# Patient Record
Sex: Female | Born: 1960 | Race: Black or African American | Hispanic: No | Marital: Married | Smoking: Current some day smoker
Health system: Southern US, Community
[De-identification: ages and names within clinical notes are randomized; demographics above are authoritative.]

## PROBLEM LIST (undated history)

## (undated) DIAGNOSIS — J3089 Other allergic rhinitis: Secondary | ICD-10-CM

## (undated) DIAGNOSIS — J449 Chronic obstructive pulmonary disease, unspecified: Secondary | ICD-10-CM

## (undated) DIAGNOSIS — Z720 Tobacco use: Secondary | ICD-10-CM

## (undated) DIAGNOSIS — Z6839 Body mass index (BMI) 39.0-39.9, adult: Secondary | ICD-10-CM

## (undated) DIAGNOSIS — I10 Essential (primary) hypertension: Secondary | ICD-10-CM

## (undated) HISTORY — DX: Other allergic rhinitis: J30.89

## (undated) HISTORY — PX: ABDOMINAL HYSTERECTOMY: SHX81

## (undated) HISTORY — DX: Essential (primary) hypertension: I10

---

## 2000-11-27 ENCOUNTER — Ambulatory Visit (HOSPITAL_COMMUNITY): Admission: RE | Admit: 2000-11-27 | Discharge: 2000-11-27 | Payer: Self-pay | Admitting: Family Medicine

## 2000-11-27 ENCOUNTER — Encounter: Payer: Self-pay | Admitting: Family Medicine

## 2002-06-10 ENCOUNTER — Ambulatory Visit (HOSPITAL_COMMUNITY): Admission: RE | Admit: 2002-06-10 | Discharge: 2002-06-10 | Payer: Self-pay | Admitting: Family Medicine

## 2002-06-10 ENCOUNTER — Encounter: Payer: Self-pay | Admitting: Family Medicine

## 2005-02-03 ENCOUNTER — Emergency Department (HOSPITAL_COMMUNITY): Admission: EM | Admit: 2005-02-03 | Discharge: 2005-02-03 | Payer: Self-pay | Admitting: Emergency Medicine

## 2008-07-15 ENCOUNTER — Emergency Department (HOSPITAL_COMMUNITY): Admission: EM | Admit: 2008-07-15 | Discharge: 2008-07-15 | Payer: Self-pay | Admitting: Emergency Medicine

## 2008-09-08 ENCOUNTER — Emergency Department (HOSPITAL_COMMUNITY): Admission: EM | Admit: 2008-09-08 | Discharge: 2008-09-08 | Payer: Self-pay | Admitting: Emergency Medicine

## 2010-03-13 ENCOUNTER — Encounter: Payer: Self-pay | Admitting: Family Medicine

## 2010-04-17 ENCOUNTER — Emergency Department (HOSPITAL_COMMUNITY)
Admission: EM | Admit: 2010-04-17 | Discharge: 2010-04-18 | Disposition: A | Discharge status: Home / Self Care | Payer: BC Managed Care – PPO | Attending: Emergency Medicine | Admitting: Emergency Medicine

## 2010-04-17 DIAGNOSIS — J209 Acute bronchitis, unspecified: Secondary | ICD-10-CM | POA: Insufficient documentation

## 2010-04-17 DIAGNOSIS — R059 Cough, unspecified: Secondary | ICD-10-CM | POA: Insufficient documentation

## 2010-04-17 DIAGNOSIS — R05 Cough: Secondary | ICD-10-CM | POA: Insufficient documentation

## 2010-04-18 ENCOUNTER — Emergency Department (HOSPITAL_COMMUNITY): Payer: BC Managed Care – PPO

## 2010-05-29 LAB — URINALYSIS, ROUTINE W REFLEX MICROSCOPIC
Bilirubin Urine: NEGATIVE
Nitrite: NEGATIVE
Specific Gravity, Urine: 1.02 (ref 1.005–1.030)
Urobilinogen, UA: 0.2 mg/dL (ref 0.0–1.0)

## 2010-05-29 LAB — URINE CULTURE

## 2011-03-14 ENCOUNTER — Emergency Department (HOSPITAL_COMMUNITY): Payer: BC Managed Care – PPO

## 2011-03-14 ENCOUNTER — Encounter (HOSPITAL_COMMUNITY): Payer: Self-pay | Admitting: Emergency Medicine

## 2011-03-14 ENCOUNTER — Emergency Department (HOSPITAL_COMMUNITY)
Admission: EM | Admit: 2011-03-14 | Discharge: 2011-03-14 | Disposition: A | Discharge status: Home / Self Care | Payer: BC Managed Care – PPO | Attending: Emergency Medicine | Admitting: Emergency Medicine

## 2011-03-14 ENCOUNTER — Other Ambulatory Visit: Payer: Self-pay

## 2011-03-14 DIAGNOSIS — R05 Cough: Secondary | ICD-10-CM

## 2011-03-14 DIAGNOSIS — J40 Bronchitis, not specified as acute or chronic: Secondary | ICD-10-CM

## 2011-03-14 DIAGNOSIS — R059 Cough, unspecified: Secondary | ICD-10-CM | POA: Insufficient documentation

## 2011-03-14 DIAGNOSIS — R079 Chest pain, unspecified: Secondary | ICD-10-CM | POA: Insufficient documentation

## 2011-03-14 DIAGNOSIS — Z79899 Other long term (current) drug therapy: Secondary | ICD-10-CM | POA: Insufficient documentation

## 2011-03-14 DIAGNOSIS — F17200 Nicotine dependence, unspecified, uncomplicated: Secondary | ICD-10-CM | POA: Insufficient documentation

## 2011-03-14 DIAGNOSIS — R55 Syncope and collapse: Secondary | ICD-10-CM

## 2011-03-14 DIAGNOSIS — J45909 Unspecified asthma, uncomplicated: Secondary | ICD-10-CM | POA: Insufficient documentation

## 2011-03-14 LAB — URINALYSIS, ROUTINE W REFLEX MICROSCOPIC
Leukocytes, UA: NEGATIVE
Nitrite: NEGATIVE
Specific Gravity, Urine: 1.02 (ref 1.005–1.030)
Urobilinogen, UA: 0.2 mg/dL (ref 0.0–1.0)
pH: 6 (ref 5.0–8.0)

## 2011-03-14 LAB — POCT I-STAT TROPONIN I
Troponin i, poc: 0 ng/mL (ref 0.00–0.08)
Troponin i, poc: 0 ng/mL (ref 0.00–0.08)

## 2011-03-14 LAB — POCT I-STAT, CHEM 8
Calcium, Ion: 1.19 mmol/L (ref 1.12–1.32)
HCT: 53 % — ABNORMAL HIGH (ref 36.0–46.0)
Hemoglobin: 18 g/dL — ABNORMAL HIGH (ref 12.0–15.0)
Sodium: 140 mEq/L (ref 135–145)
TCO2: 28 mmol/L (ref 0–100)

## 2011-03-14 LAB — URINE CULTURE: Culture: NO GROWTH

## 2011-03-14 MED ORDER — PREDNISONE 20 MG PO TABS: 20.0000 mg | ORAL_TABLET | Freq: Every day | ORAL | Status: AC

## 2011-03-14 MED ORDER — IOHEXOL 350 MG/ML SOLN
100.0000 mL | Freq: Once | INTRAVENOUS | Status: AC | PRN
  Administered 2011-03-14: 100 mL via INTRAVENOUS

## 2011-03-14 MED ORDER — AZITHROMYCIN 250 MG PO TABS: 250.0000 mg | ORAL_TABLET | Freq: Every day | ORAL | Status: AC

## 2011-03-14 MED ORDER — ALBUTEROL SULFATE HFA 108 (90 BASE) MCG/ACT IN AERS
2.0000 | INHALATION_SPRAY | RESPIRATORY_TRACT | Status: AC
  Administered 2011-03-14: 2 via RESPIRATORY_TRACT
  Filled 2011-03-14: qty 6.7

## 2011-03-14 MED ORDER — SODIUM CHLORIDE 0.9 % IV SOLN
INTRAVENOUS | Status: DC
  Administered 2011-03-14: 14:00:00 via INTRAVENOUS

## 2011-03-14 MED ORDER — POTASSIUM CHLORIDE 20 MEQ/15ML (10%) PO LIQD
40.0000 meq | Freq: Once | ORAL | Status: AC
  Administered 2011-03-14: 40 meq via ORAL
  Filled 2011-03-14: qty 30

## 2011-03-14 MED ORDER — IPRATROPIUM BROMIDE 0.02 % IN SOLN
0.5000 mg | Freq: Once | RESPIRATORY_TRACT | Status: AC
  Administered 2011-03-14: 0.5 mg via RESPIRATORY_TRACT
  Filled 2011-03-14: qty 2.5

## 2011-03-14 MED ORDER — ACETAMINOPHEN 500 MG PO TABS
1000.0000 mg | ORAL_TABLET | Freq: Once | ORAL | Status: AC
Start: 2011-03-14 — End: 2011-03-14
  Administered 2011-03-14: 1000 mg via ORAL
  Filled 2011-03-14: qty 2

## 2011-03-14 MED ORDER — PREDNISONE 20 MG PO TABS
60.0000 mg | ORAL_TABLET | Freq: Once | ORAL | Status: AC
  Administered 2011-03-14: 60 mg via ORAL
  Filled 2011-03-14: qty 3

## 2011-03-14 MED ORDER — ALBUTEROL SULFATE (5 MG/ML) 0.5% IN NEBU
5.0000 mg | INHALATION_SOLUTION | Freq: Once | RESPIRATORY_TRACT | Status: AC
  Administered 2011-03-14: 5 mg via RESPIRATORY_TRACT
  Filled 2011-03-14: qty 1
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
## 2011-03-14 NOTE — ED Notes (Signed)
Pt requesting Tylenol for pain. Will notify MD of pt request.

## 2012-06-12 ENCOUNTER — Ambulatory Visit (INDEPENDENT_AMBULATORY_CARE_PROVIDER_SITE_OTHER): Payer: BC Managed Care – PPO | Admitting: Nurse Practitioner

## 2012-06-12 ENCOUNTER — Encounter: Payer: Self-pay | Admitting: Nurse Practitioner

## 2012-06-12 VITALS — BP 132/106 | Temp 98.7°F | Wt 282.6 lb

## 2012-06-12 DIAGNOSIS — R591 Generalized enlarged lymph nodes: Secondary | ICD-10-CM

## 2012-06-12 DIAGNOSIS — R599 Enlarged lymph nodes, unspecified: Secondary | ICD-10-CM

## 2012-06-12 DIAGNOSIS — K047 Periapical abscess without sinus: Secondary | ICD-10-CM

## 2012-06-12 DIAGNOSIS — K089 Disorder of teeth and supporting structures, unspecified: Secondary | ICD-10-CM | POA: Insufficient documentation

## 2012-06-12 MED ORDER — PENICILLIN V POTASSIUM 500 MG PO TABS: 500.0000 mg | ORAL_TABLET | Freq: Four times a day (QID) | ORAL | Status: AC

## 2012-06-12 MED ORDER — HYDROCODONE-ACETAMINOPHEN 5-325 MG PO TABS: 1.0000 | ORAL_TABLET | ORAL | Status: DC | PRN

## 2012-06-12 NOTE — Assessment & Plan Note (Signed)
Pen VK as directed for abscess. Vicodin as directed for pain. Recommend dental care.

## 2012-06-12 NOTE — Progress Notes (Signed)
Subjective:  Presents for complaints of swelling and pain along the left lower face that began yesterday morning. Patient was eating a breakfast sandwich and drinking some orange juice. Felt some swelling after a bite of her sandwich with a swallow of orange juice. No burning pain or sore throat. No trouble breathing. No fever. No itching hives or rash. No known allergens. No nausea vomiting. Had a slight nosebleed this morning, thinks she swallowed some of the blood. After this began having some mild abdominal pain. No reflux or abdominal pain before this.  Objective:   BP 132/106  Temp(Src) 98.7 F (37.1 C) (Oral)  Wt 282 lb 9.6 oz (128.187 kg)  BMI 44.25 kg/m2 NAD. Alert, oriented. TMs minimal clear effusion, no erythema. Pharynx clear. Moderate swelling on the lower anterior bucca mucosa, nontender very fluctuant with no lesions or air edema noted. Extremely poor dentition. Externally on the lower mid mandible a small firm nodule noted which is slightly tender to palpation. Neck supple with minimal adenopathy on the right, large tender anterior cervical lymph nodes noted on the left. Lungs clear. Heart regular rate rhythm. Nasal mucosa clear. Abdomen soft nondistended with minimal epigastric area tenderness.  Assessment:Tooth abscess  Tender lymph node  Poor dentition   Plan: Pen VK as directed. Vicodin as directed for severe pain. Warm compresses. Reviewed first aid for nose bleeds. Call back if symptoms worsen or persist.

## 2012-06-17 ENCOUNTER — Telehealth: Payer: Self-pay | Admitting: Family Medicine

## 2012-06-17 NOTE — Telephone Encounter (Signed)
Diflucan 150mg  one po called into [facility] [place] [facility] pt notified

## 2012-06-17 NOTE — Telephone Encounter (Signed)
Diflucan 150 mg one po

## 2012-06-17 NOTE — Telephone Encounter (Signed)
Patient requesting med to be called in for yeast infection, seen Lindsay Nelson recently and antibiotics caused yeast infection, please notify when done [facility]

## 2012-06-25 ENCOUNTER — Encounter: Payer: Self-pay | Admitting: *Deleted

## 2012-06-27 ENCOUNTER — Ambulatory Visit: Payer: BC Managed Care – PPO | Admitting: Nurse Practitioner

## 2014-06-27 ENCOUNTER — Encounter (HOSPITAL_COMMUNITY): Payer: Self-pay | Admitting: *Deleted

## 2014-06-27 ENCOUNTER — Emergency Department (HOSPITAL_COMMUNITY): Payer: BLUE CROSS/BLUE SHIELD

## 2014-06-27 ENCOUNTER — Emergency Department (HOSPITAL_COMMUNITY)
Admission: EM | Admit: 2014-06-27 | Discharge: 2014-06-27 | Disposition: A | Discharge status: Home / Self Care | Payer: BLUE CROSS/BLUE SHIELD | Attending: Emergency Medicine | Admitting: Emergency Medicine

## 2014-06-27 DIAGNOSIS — Z720 Tobacco use: Secondary | ICD-10-CM | POA: Insufficient documentation

## 2014-06-27 DIAGNOSIS — R109 Unspecified abdominal pain: Secondary | ICD-10-CM | POA: Diagnosis not present

## 2014-06-27 DIAGNOSIS — M549 Dorsalgia, unspecified: Secondary | ICD-10-CM | POA: Diagnosis present

## 2014-06-27 DIAGNOSIS — Z79899 Other long term (current) drug therapy: Secondary | ICD-10-CM | POA: Insufficient documentation

## 2014-06-27 DIAGNOSIS — M545 Low back pain, unspecified: Secondary | ICD-10-CM

## 2014-06-27 DIAGNOSIS — I10 Essential (primary) hypertension: Secondary | ICD-10-CM | POA: Diagnosis not present

## 2014-06-27 DIAGNOSIS — Z8709 Personal history of other diseases of the respiratory system: Secondary | ICD-10-CM | POA: Insufficient documentation

## 2014-06-27 DIAGNOSIS — J45909 Unspecified asthma, uncomplicated: Secondary | ICD-10-CM | POA: Diagnosis not present

## 2014-06-27 LAB — COMPREHENSIVE METABOLIC PANEL
ALK PHOS: 101 U/L (ref 38–126)
ALT: 10 U/L — AB (ref 14–54)
ANION GAP: 7 (ref 5–15)
AST: 15 U/L (ref 15–41)
Albumin: 3.5 g/dL (ref 3.5–5.0)
BILIRUBIN TOTAL: 0.5 mg/dL (ref 0.3–1.2)
BUN: 19 mg/dL (ref 6–20)
CO2: 25 mmol/L (ref 22–32)
Calcium: 8.8 mg/dL — ABNORMAL LOW (ref 8.9–10.3)
Chloride: 109 mmol/L (ref 101–111)
Creatinine, Ser: 0.85 mg/dL (ref 0.44–1.00)
GFR calc non Af Amer: >60 mL/min (ref >60)
GLUCOSE: 111 mg/dL — AB (ref 70–99)
Potassium: 3.7 mmol/L (ref 3.5–5.1)
Sodium: 141 mmol/L (ref 135–145)
Total Protein: 6.7 g/dL (ref 6.5–8.1)

## 2014-06-27 LAB — CBC WITH DIFFERENTIAL/PLATELET
BASOS ABS: 0 10*3/uL (ref 0.0–0.1)
BASOS PCT: 0 % (ref 0–1)
Eosinophils Absolute: 0 10*3/uL (ref 0.0–0.7)
Eosinophils Relative: 1 % (ref 0–5)
HCT: 47.6 % — ABNORMAL HIGH (ref 36.0–46.0)
Hemoglobin: 15.9 g/dL — ABNORMAL HIGH (ref 12.0–15.0)
LYMPHS PCT: 32 % (ref 12–46)
Lymphs Abs: 2.2 10*3/uL (ref 0.7–4.0)
MCH: 32.3 pg (ref 26.0–34.0)
MCHC: 33.4 g/dL (ref 30.0–36.0)
MCV: 96.6 fL (ref 78.0–100.0)
MONOS PCT: 9 % (ref 3–12)
Monocytes Absolute: 0.6 10*3/uL (ref 0.1–1.0)
NEUTROS ABS: 4.1 10*3/uL (ref 1.7–7.7)
NEUTROS PCT: 58 % (ref 43–77)
Platelets: 176 10*3/uL (ref 150–400)
RBC: 4.93 MIL/uL (ref 3.87–5.11)
RDW: 13.7 % (ref 11.5–15.5)
WBC: 7 10*3/uL (ref 4.0–10.5)

## 2014-06-27 LAB — URINALYSIS, ROUTINE W REFLEX MICROSCOPIC
BILIRUBIN URINE: NEGATIVE
Glucose, UA: NEGATIVE mg/dL
Ketones, ur: NEGATIVE mg/dL
Leukocytes, UA: NEGATIVE
Nitrite: NEGATIVE
PH: 5.5 (ref 5.0–8.0)
Protein, ur: NEGATIVE mg/dL
SPECIFIC GRAVITY, URINE: 1.015 (ref 1.005–1.030)
Urobilinogen, UA: 0.2 mg/dL (ref 0.0–1.0)

## 2014-06-27 LAB — URINE MICROSCOPIC-ADD ON

## 2014-06-27 MED ORDER — HYDROMORPHONE HCL 1 MG/ML IJ SOLN
0.5000 mg | Freq: Once | INTRAMUSCULAR | Status: AC
  Administered 2014-06-27: 0.5 mg via INTRAVENOUS
  Filled 2014-06-27: qty 1

## 2014-06-27 MED ORDER — TRAMADOL HCL 50 MG PO TABS: 50.0000 mg | ORAL_TABLET | Freq: Four times a day (QID) | ORAL | Status: DC | PRN

## 2014-06-27 MED ORDER — IBUPROFEN 800 MG PO TABS: 800.0000 mg | ORAL_TABLET | Freq: Three times a day (TID) | ORAL | Status: DC

## 2014-06-27 NOTE — Discharge Instructions (Signed)
Back Pain, Adult Low back pain is very common. About 1 in 5 people have back pain.The cause of low back pain is rarely dangerous. The pain often gets better over time.About half of people with a sudden onset of back pain feel better in just 2 weeks. About 8 in 10 people feel better by 6 weeks.  CAUSES Some common causes of back pain include:  Strain of the muscles or ligaments supporting the spine.  Wear and tear (degeneration) of the spinal discs.  Arthritis.  Direct injury to the back. DIAGNOSIS Most of the time, the direct cause of low back pain is not known.However, back pain can be treated effectively even when the exact cause of the pain is unknown.Answering your caregiver's questions about your overall health and symptoms is one of the most accurate ways to make sure the cause of your pain is not dangerous. If your caregiver needs more information, he or she may order lab work or imaging tests (X-rays or MRIs).However, even if imaging tests show changes in your back, this usually does not require surgery. HOME CARE INSTRUCTIONS For many people, back pain returns.Since low back pain is rarely dangerous, it is often a condition that people can learn to manageon their own.   Remain active. It is stressful on the back to sit or stand in one place. Do not sit, drive, or stand in one place for more than 30 minutes at a time. Take short walks on level surfaces as soon as pain allows.Try to increase the length of time you walk each day.  Do not stay in bed.Resting more than 1 or 2 days can delay your recovery.  Do not avoid exercise or work.Your body is made to move.It is not dangerous to be active, even though your back may hurt.Your back will likely heal faster if you return to being active before your pain is gone.  Pay attention to your body when you bend and lift. Many people have less discomfortwhen lifting if they bend their knees, keep the load close to their bodies,and  avoid twisting. Often, the most comfortable positions are those that put less stress on your recovering back.  Find a comfortable position to sleep. Use a firm mattress and lie on your side with your knees slightly bent. If you lie on your back, put a pillow under your knees.  Only take over-the-counter or prescription medicines as directed by your caregiver. Over-the-counter medicines to reduce pain and inflammation are often the most helpful.Your caregiver may prescribe muscle relaxant drugs.These medicines help dull your pain so you can more quickly return to your normal activities and healthy exercise.  Put ice on the injured area.  Put ice in a plastic bag.  Place a towel between your skin and the bag.  Leave the ice on for 15-20 minutes, 03-04 times a day for the first 2 to 3 days. After that, ice and heat may be alternated to reduce pain and spasms.  Ask your caregiver about trying back exercises and gentle massage. This may be of some benefit.  Avoid feeling anxious or stressed.Stress increases muscle tension and can worsen back pain.It is important to recognize when you are anxious or stressed and learn ways to manage it.Exercise is a great option. SEEK MEDICAL CARE IF:  You have pain that is not relieved with rest or medicine.  You have pain that does not improve in 1 week.  You have new symptoms.  You are generally not feeling well. SEEK   IMMEDIATE MEDICAL CARE IF:   You have pain that radiates from your back into your legs.  You develop new bowel or bladder control problems.  You have unusual weakness or numbness in your arms or legs.  You develop nausea or vomiting.  You develop abdominal pain.  You feel faint. Document Released: 02/06/2005 Document Revised: 08/08/2011 Document Reviewed: 06/10/2013 Exit[employer] Patient Information 2015 [employer]. This information is not intended to replace advice given to you by your health care provider. Make sure you  discuss any questions you have with your health care provider.  

## 2014-06-27 NOTE — ED Notes (Signed)
Pt c/o left sided flank pain since yesterday morning. Denies n/v or any urinary symptoms. Does c/o diarrhea.

## 2014-06-27 NOTE — ED Provider Notes (Signed)
CSN: [id number]     Arrival date & time 06/27/14  0612 History   First MD Initiated Contact with Patient 06/27/14 [phone]     Chief Complaint  Patient presents with  . Flank Pain     (Consider location/radiation/quality/duration/timing/severity/associated sxs/prior Treatment) HPI Comments: Patient presents to the ER for evaluation of left sided flank and back pain that started yesterday. Patient denies any injury. Pain is constant and severe, but does worsen with bending, twisting of the back. She does not have any associated chest pain or shortness of breath. There has not been any fever. Patient denies urinary symptoms. No nausea, vomiting, diarrhea or constipation. There is no rash associated with the pain.   Patient is a 54 y.o. female presenting with flank pain.  Flank Pain    Past Medical History  Diagnosis Date  . Asthma   . Hypertension   . Perennial allergic rhinitis    Past Surgical History  Procedure Laterality Date  . Abdominal hysterectomy     History reviewed. No pertinent family history. History  Substance Use Topics  . Smoking status: Current Every Day Smoker    Types: Cigarettes  . Smokeless tobacco: Not on file  . Alcohol Use: Yes   OB History    No data available     Review of Systems  Genitourinary: Positive for flank pain.  Musculoskeletal: Positive for back pain.  All other systems reviewed and are negative.     Allergies  Review of patient's allergies indicates no known allergies.  Home Medications   Prior to Admission medications   Medication Sig Start Date End Date Taking? Authorizing Provider  acetaminophen (TYLENOL) 500 MG tablet Take 500 mg by mouth every 6 (six) hours as needed for mild pain.    Yes Historical Provider, MD  cetirizine (ZYRTEC) 10 MG tablet Take 10 mg by mouth at bedtime.    Yes Historical Provider, MD  ibuprofen (ADVIL,MOTRIN) 200 MG tablet Take 200 mg by mouth every 6 (six) hours as needed for moderate pain.   Yes  Historical Provider, MD  loratadine (CLARITIN) 10 MG tablet Take 10 mg by mouth daily.   Yes Historical Provider, MD   BP 104/86 mmHg  Pulse 69  Temp(Src) 98.4 F (36.9 C) (Oral)  Resp 18  SpO2 96% Physical Exam  Constitutional: She is oriented to person, place, and time. She appears well-developed and well-nourished. No distress.  HENT:  Head: Normocephalic and atraumatic.  Right Ear: Hearing normal.  Left Ear: Hearing normal.  Nose: Nose normal.  Mouth/Throat: Oropharynx is clear and moist and mucous membranes are normal.  Eyes: Conjunctivae and EOM are normal. Pupils are equal, round, and reactive to light.  Neck: Normal range of motion. Neck supple.  Cardiovascular: Regular rhythm, S1 normal and S2 normal.  Exam reveals no gallop and no friction rub.   No murmur heard. Pulmonary/Chest: Effort normal and breath sounds normal. No respiratory distress. She exhibits no tenderness.  Abdominal: Soft. Normal appearance and bowel sounds are normal. There is no hepatosplenomegaly. There is no tenderness. There is no rebound, no guarding, no tenderness at McBurney's point and negative Murphy's sign. No hernia.  Musculoskeletal: Normal range of motion.       Thoracic back: She exhibits tenderness.       Back:  Neurological: She is alert and oriented to person, place, and time. She has normal strength. No cranial nerve deficit or sensory deficit. Coordination normal. GCS eye subscore is 4. GCS verbal subscore is 5.  GCS motor subscore is 6.  Skin: Skin is warm, dry and intact. No rash noted. No cyanosis.  Psychiatric: She has a normal mood and affect. Her speech is normal and behavior is normal. Thought content normal.  Nursing note and vitals reviewed.   ED Course  Procedures (including critical care time) Labs Review Labs Reviewed  CBC WITH DIFFERENTIAL/PLATELET - Abnormal; Notable for the following:    Hemoglobin 15.9 (*)    HCT 47.6 (*)    All other components within normal limits    COMPREHENSIVE METABOLIC PANEL - Abnormal; Notable for the following:    Glucose, Bld 111 (*)    Calcium 8.8 (*)    ALT 10 (*)    All other components within normal limits  URINALYSIS, ROUTINE W REFLEX MICROSCOPIC - Abnormal; Notable for the following:    APPearance HAZY (*)    Hgb urine dipstick SMALL (*)    All other components within normal limits  URINE MICROSCOPIC-ADD ON    Imaging Review Ct Renal Stone Study  06/27/2014   CLINICAL DATA:  54 year old female with a history of left flank pain  EXAM: CT ABDOMEN AND PELVIS WITHOUT CONTRAST  TECHNIQUE: Multidetector CT imaging of the abdomen and pelvis was performed following the standard protocol without IV contrast.  COMPARISON:  Plain film 07/15/2008  FINDINGS: Lower chest:  Unremarkable appearance of the superficial soft tissues the chest wall.  Heart size within normal limits.  No pericardial fluid/ thickening.  Small hiatal hernia.  No lower mediastinal adenopathy.  Respiratory motion somewhat limits evaluation of the lungs.  Trace atelectasis at the bases.  No pleural fluid.  Abdomen/pelvis:  Unremarkable appearance of liver and spleen.  Unremarkable appearance of the right adrenal gland. Rounded lesion associated with the left adrenal gland measuring 2 cm and fat Hounsfield units, compatible with adenoma.  No peripancreatic or pericholecystic fluid or inflammatory changes.  No radio-opaque gallstones.  No intrahepatic or extrahepatic biliary ductal dilatation.  No intra-peritoneal free air or significant free-fluid.  No abnormally dilated small bowel or colon. No transition point. No inflammatory changes of the mesenteries.  Normal appendix identified.  No diverticular disease.  Right Kidney/Ureter:  No hydronephrosis. No nephrolithiasis. No perinephric stranding. Unremarkable course of the right ureter.  Left Kidney/Ureter:  No hydronephrosis. No nephrolithiasis. No perinephric stranding.  Unremarkable course of the left ureter.  Unremarkable  appearance of the urinary bladder.  Surgical changes of hysterectomy. Unremarkable appearance of the right adnexa. No left adnexa identified.  No significant vascular calcification. No aneurysm or periaortic fluid identified.  Musculoskeletal:  No displaced fracture identified.  No significant degenerative changes of the spine.  :  IMPRESSION: Study is negative for nephrolithiasis or hydronephrosis. No finding to account for the patient's complaints of flank pain.  Signed,  Dulcy Fanny. Earleen Newport, DO  [facility]  [facility]   Electronically Signed   By: Corrie Mckusick D.O.   On: 06/27/2014 09:12     EKG Interpretation None      MDM   Final diagnoses:  Left flank pain    Patient presents to the ER for evaluation of left flank pain. Patient did have pain in the left side of her back area that was reproducible with movements. There has not been any injury. Abdominal exam was benign, nontender. Lab work was normal. Urinalysis didn't show some red cells in her urine, CT scan was performed to rule out kidney stone. No abnormality was seen. Patient will be discharged,  treated with analgesia and rest. Follow-up with primary doctor.    Orpah Greek, MD 06/27/14 [phone]

## 2014-06-27 NOTE — ED Notes (Signed)
MD at bedside. 

## 2014-06-27 NOTE — ED Provider Notes (Signed)
MSE was initiated and I personally evaluated the patient and placed orders (if any) at  6:48 AM on Jun 27, 2014.  The patient appears stable so that the remainder of the MSE may be completed by another provider.  Left sided back and flank pain without radiation. Vitals normal. Will tx pain and obtain labs and urine at this time. Pt will be seen by the oncoming provider at [facility], MD 06/27/14 [phone]

## 2017-04-16 ENCOUNTER — Other Ambulatory Visit: Payer: Self-pay

## 2017-04-16 ENCOUNTER — Emergency Department (HOSPITAL_COMMUNITY): Payer: BLUE CROSS/BLUE SHIELD

## 2017-04-16 ENCOUNTER — Encounter (HOSPITAL_COMMUNITY): Payer: Self-pay | Admitting: Emergency Medicine

## 2017-04-16 ENCOUNTER — Observation Stay (HOSPITAL_COMMUNITY)
Admission: EM | Admit: 2017-04-16 | Discharge: 2017-04-17 | Disposition: A | Discharge status: Home / Self Care | Payer: BLUE CROSS/BLUE SHIELD | Attending: Family Medicine | Admitting: Family Medicine

## 2017-04-16 DIAGNOSIS — Z720 Tobacco use: Secondary | ICD-10-CM | POA: Diagnosis present

## 2017-04-16 DIAGNOSIS — D751 Secondary polycythemia: Secondary | ICD-10-CM | POA: Diagnosis present

## 2017-04-16 DIAGNOSIS — Z8249 Family history of ischemic heart disease and other diseases of the circulatory system: Secondary | ICD-10-CM | POA: Insufficient documentation

## 2017-04-16 DIAGNOSIS — E876 Hypokalemia: Secondary | ICD-10-CM | POA: Diagnosis present

## 2017-04-16 DIAGNOSIS — R9431 Abnormal electrocardiogram [ECG] [EKG]: Secondary | ICD-10-CM | POA: Diagnosis not present

## 2017-04-16 DIAGNOSIS — R51 Headache: Secondary | ICD-10-CM | POA: Insufficient documentation

## 2017-04-16 DIAGNOSIS — R0902 Hypoxemia: Secondary | ICD-10-CM

## 2017-04-16 DIAGNOSIS — F17210 Nicotine dependence, cigarettes, uncomplicated: Secondary | ICD-10-CM | POA: Diagnosis not present

## 2017-04-16 DIAGNOSIS — J441 Chronic obstructive pulmonary disease with (acute) exacerbation: Secondary | ICD-10-CM | POA: Diagnosis not present

## 2017-04-16 DIAGNOSIS — J4551 Severe persistent asthma with (acute) exacerbation: Secondary | ICD-10-CM

## 2017-04-16 DIAGNOSIS — R519 Headache, unspecified: Secondary | ICD-10-CM | POA: Diagnosis present

## 2017-04-16 DIAGNOSIS — I10 Essential (primary) hypertension: Secondary | ICD-10-CM | POA: Diagnosis not present

## 2017-04-16 DIAGNOSIS — Z79899 Other long term (current) drug therapy: Secondary | ICD-10-CM | POA: Diagnosis not present

## 2017-04-16 HISTORY — DX: Tobacco use: Z72.0

## 2017-04-16 HISTORY — DX: Chronic obstructive pulmonary disease, unspecified: J44.9

## 2017-04-16 HISTORY — DX: Body mass index (BMI) 39.0-39.9, adult: Z68.39

## 2017-04-16 LAB — BASIC METABOLIC PANEL
Anion gap: 11 (ref 5–15)
BUN: 14 mg/dL (ref 6–20)
CALCIUM: 8.5 mg/dL — AB (ref 8.9–10.3)
CO2: 25 mmol/L (ref 22–32)
Chloride: 102 mmol/L (ref 101–111)
Creatinine, Ser: 0.96 mg/dL (ref 0.44–1.00)
Glucose, Bld: 151 mg/dL — ABNORMAL HIGH (ref 65–99)
POTASSIUM: 3.4 mmol/L — AB (ref 3.5–5.1)
Sodium: 138 mmol/L (ref 135–145)

## 2017-04-16 LAB — CBC WITH DIFFERENTIAL/PLATELET
BASOS PCT: 0 %
Basophils Absolute: 0 10*3/uL (ref 0.0–0.1)
EOS PCT: 0 %
Eosinophils Absolute: 0 10*3/uL (ref 0.0–0.7)
HEMATOCRIT: 47.3 % — AB (ref 36.0–46.0)
Hemoglobin: 15.1 g/dL — ABNORMAL HIGH (ref 12.0–15.0)
LYMPHS PCT: 22 %
Lymphs Abs: 0.9 10*3/uL (ref 0.7–4.0)
MCH: 31.2 pg (ref 26.0–34.0)
MCHC: 31.9 g/dL (ref 30.0–36.0)
MCV: 97.7 fL (ref 78.0–100.0)
Monocytes Absolute: 0.2 10*3/uL (ref 0.1–1.0)
Monocytes Relative: 4 %
NEUTROS ABS: 3 10*3/uL (ref 1.7–7.7)
Neutrophils Relative %: 74 %
PLATELETS: 120 10*3/uL — AB (ref 150–400)
RBC: 4.84 MIL/uL (ref 3.87–5.11)
RDW: 13.3 % (ref 11.5–15.5)
WBC: 4.1 10*3/uL (ref 4.0–10.5)

## 2017-04-16 MED ORDER — PREDNISONE 50 MG PO TABS: ORAL_TABLET | ORAL | 0 refills | Status: DC

## 2017-04-16 MED ORDER — DIPHENHYDRAMINE HCL 50 MG/ML IJ SOLN
25.0000 mg | Freq: Once | INTRAMUSCULAR | Status: AC
  Administered 2017-04-16: 25 mg via INTRAMUSCULAR
  Filled 2017-04-16: qty 1

## 2017-04-16 MED ORDER — DEXAMETHASONE SODIUM PHOSPHATE 10 MG/ML IJ SOLN
10.0000 mg | Freq: Once | INTRAMUSCULAR | Status: AC
  Administered 2017-04-16: 10 mg via INTRAMUSCULAR
  Filled 2017-04-16: qty 1

## 2017-04-16 MED ORDER — ALBUTEROL SULFATE HFA 108 (90 BASE) MCG/ACT IN AERS: 1.0000 | INHALATION_SPRAY | Freq: Four times a day (QID) | RESPIRATORY_TRACT | 0 refills | Status: DC | PRN

## 2017-04-16 MED ORDER — BENZONATATE 100 MG PO CAPS
200.0000 mg | ORAL_CAPSULE | Freq: Once | ORAL | Status: AC
  Administered 2017-04-16: 200 mg via ORAL
  Filled 2017-04-16: qty 2

## 2017-04-16 MED ORDER — ALBUTEROL SULFATE (2.5 MG/3ML) 0.083% IN NEBU: 2.5000 mg | INHALATION_SOLUTION | Freq: Four times a day (QID) | RESPIRATORY_TRACT | 12 refills | Status: DC | PRN

## 2017-04-16 MED ORDER — ALBUTEROL (5 MG/ML) CONTINUOUS INHALATION SOLN
10.0000 mg/h | INHALATION_SOLUTION | Freq: Once | RESPIRATORY_TRACT | Status: AC
  Administered 2017-04-16: 10 mg/h via RESPIRATORY_TRACT
  Filled 2017-04-16: qty 20

## 2017-04-16 MED ORDER — ALBUTEROL SULFATE (2.5 MG/3ML) 0.083% IN NEBU
2.5000 mg | INHALATION_SOLUTION | Freq: Once | RESPIRATORY_TRACT | Status: AC
  Administered 2017-04-16: 2.5 mg via RESPIRATORY_TRACT
  Filled 2017-04-16: qty 3

## 2017-04-16 MED ORDER — BENZONATATE 100 MG PO CAPS: 200.0000 mg | ORAL_CAPSULE | Freq: Three times a day (TID) | ORAL | 0 refills | Status: DC | PRN

## 2017-04-16 MED ORDER — IPRATROPIUM-ALBUTEROL 0.5-2.5 (3) MG/3ML IN SOLN
3.0000 mL | Freq: Once | RESPIRATORY_TRACT | Status: AC
  Administered 2017-04-16: 3 mL via RESPIRATORY_TRACT
  Filled 2017-04-16: qty 3

## 2017-04-16 MED ORDER — PROCHLORPERAZINE EDISYLATE 5 MG/ML IJ SOLN
10.0000 mg | Freq: Once | INTRAMUSCULAR | Status: AC
  Administered 2017-04-16: 10 mg via INTRAMUSCULAR
  Filled 2017-04-16: qty 2

## 2017-04-16 MED ORDER — POTASSIUM CHLORIDE CRYS ER 20 MEQ PO TBCR
40.0000 meq | EXTENDED_RELEASE_TABLET | Freq: Once | ORAL | Status: AC
  Administered 2017-04-17: 40 meq via ORAL
  Filled 2017-04-16: qty 2

## 2017-04-16 NOTE — ED Provider Notes (Addendum)
With shortness of breath and mild cough for the past 3 days.  She also complains of headache which is worse with coughing onset 2 days ago.  She has no headache presently.  Patient reports she is been smoking since age 57.  I counseled patient for 5 minutes on smoking cessation Chest x-ray viewed by me   Orlie Dakin, MD 04/16/17 [id number]    Orlie Dakin, MD 04/16/17 2330

## 2017-04-16 NOTE — ED Notes (Signed)
Pt given a meal tray and drink; pt's family member also given a drink and snack; pt sitting on side of bed and encouraged to call for assistance

## 2017-04-16 NOTE — ED Provider Notes (Signed)
[facility] EMERGENCY DEPARTMENT Provider Note   CSN: [id number] Arrival date & time: 04/16/17  1408     History   Chief Complaint Chief Complaint  Patient presents with  . Headache    HPI Lindsay Nelson is a 57 y.o. female with a past medical history of asthma and hypertension presenting with a 3-day history of headache described as constant sharp pain which started at the base of her left posterior scalp but now radiates into the left forehead and behind her left eye and is been associated with light sensitivity.  Symptoms are worsened by coughing, stating she frequently gets a headache with episodes of cough.  She is also been wheezing without shortness of breath for the past several days.  Her cough is been nonproductive.  She denies fevers or chills, nausea, vomiting, chest pain but has been short of breath.  She is a 1 pack a day smoker but has not smoked since her cough started.  She denies dizziness, focal weakness she has taken Excedrin with transient relief of symptoms.  The history is provided by the patient.    Past Medical History:  Diagnosis Date  . Asthma   . Hypertension   . Perennial allergic rhinitis     Patient Active Problem List   Diagnosis Date Noted  . Poor dentition 06/12/2012    Past Surgical History:  Procedure Laterality Date  . ABDOMINAL HYSTERECTOMY      OB History    No data available       Home Medications    Prior to Admission medications   Medication Sig Start Date End Date Taking? Authorizing Provider  acetaminophen (TYLENOL) 500 MG tablet Take 500 mg by mouth every 6 (six) hours as needed for mild pain.     [provider]  albuterol (PROVENTIL HFA;VENTOLIN HFA) 108 (90 Base) MCG/ACT inhaler Inhale 1-2 puffs into the lungs every 6 (six) hours as needed for wheezing or shortness of breath. 04/16/17   Anesia Blackwell, Almyra Free, PA-C  albuterol (PROVENTIL) (2.5 MG/3ML) 0.083% nebulizer solution Take 3 mLs (2.5 mg total) by nebulization  every 6 (six) hours as needed for wheezing or shortness of breath. 04/16/17   Consepcion Utt, Almyra Free, PA-C  benzonatate (TESSALON) 100 MG capsule Take 2 capsules (200 mg total) by mouth 3 (three) times daily as needed. 04/16/17   Evalee Jefferson, PA-C  cetirizine (ZYRTEC) 10 MG tablet Take 10 mg by mouth at bedtime.     [provider]  ibuprofen (ADVIL,MOTRIN) 800 MG tablet Take 1 tablet (800 mg total) by mouth 3 (three) times daily. 06/27/14   Orpah Greek, MD  loratadine (CLARITIN) 10 MG tablet Take 10 mg by mouth daily.    [provider]  predniSONE (DELTASONE) 50 MG tablet Take one tablet daily for 3 additional days 04/18/17   Evalee Jefferson, PA-C  traMADol (ULTRAM) 50 MG tablet Take 1 tablet (50 mg total) by mouth every 6 (six) hours as needed. 06/27/14   Orpah Greek, MD    Family History No family history on file.  Social History Social History   Tobacco Use  . Smoking status: Current Every Day Smoker    Packs/day: 1.00    Types: Cigarettes  . Smokeless tobacco: Current User  Substance Use Topics  . Alcohol use: Yes    Comment: occasionally   . Drug use: Yes    Types: Marijuana    Comment: last use 04/14/2017     Allergies   Patient has no  known allergies.   Review of Systems Review of Systems  Constitutional: Negative for chills and fever.  HENT: Positive for congestion. Negative for rhinorrhea, sinus pressure, sinus pain and sore throat.   Eyes: Positive for photophobia.  Respiratory: Positive for cough and wheezing. Negative for chest tightness and shortness of breath.   Cardiovascular: Negative for chest pain.  Gastrointestinal: Negative for abdominal pain, nausea and vomiting.  Genitourinary: Negative.   Musculoskeletal: Negative for arthralgias, joint swelling and neck pain.  Skin: Negative.  Negative for rash and wound.  Neurological: Positive for headaches. Negative for dizziness, weakness, light-headedness and numbness.    Psychiatric/Behavioral: Negative.      Physical Exam Updated Vital Signs BP 119/75   Pulse 72   Temp 99.4 F (37.4 C) (Oral)   Resp 16   Ht 5\' 11"  (1.803 m)   Wt 127.9 kg (282 lb)   SpO2 94%   BMI 39.33 kg/m   Physical Exam  Constitutional: She is oriented to person, place, and time. She appears well-developed and well-nourished.  Uncomfortable appearing  HENT:  Head: Normocephalic and atraumatic.  Mouth/Throat: Oropharynx is clear and moist.  Eyes: Conjunctivae and EOM are normal. Pupils are equal, round, and reactive to light. Right eye exhibits normal extraocular motion. Left eye exhibits normal extraocular motion.  Neck: Normal range of motion. Neck supple.  Cardiovascular: Normal rate, regular rhythm, normal heart sounds and intact distal pulses.  Pulmonary/Chest: Effort normal. She has wheezes.  Expiratory wheeze throughout all lung fields.  Decreased decreased breath sounds and prolonged expirations.  Abdominal: Soft. Bowel sounds are normal. There is no tenderness.  Musculoskeletal: Normal range of motion.  Lymphadenopathy:    She has no cervical adenopathy.  Neurological: She is alert and oriented to person, place, and time. She has normal strength. No sensory deficit. Coordination and gait normal. GCS eye subscore is 4. GCS verbal subscore is 5. GCS motor subscore is 6.  normal rapid alternating movements. Cranial nerves III-XII intact.  No pronator drift.  Skin: Skin is warm and dry. No rash noted.  Psychiatric: She has a normal mood and affect. Her speech is normal and behavior is normal. Thought content normal. Cognition and memory are normal.  Nursing note and vitals reviewed.    ED Treatments / Results  Labs (all labs ordered are listed, but only abnormal results are displayed) Labs Reviewed - No data to display  EKG  EKG Interpretation None       Radiology Dg Chest 2 View  Result Date: 04/16/2017 CLINICAL DATA:  Shortness of breath, cough EXAM:  CHEST  2 VIEW COMPARISON:  03/14/2011 FINDINGS: Peribronchial thickening and interstitial prominence, likely bronchitic changes. Heart is normal size. No confluent airspace opacities or effusions. No acute bony abnormality. IMPRESSION: Bronchitic changes. Electronically Signed   By: Rolm Baptise M.D.   On: 04/16/2017 19:55   Ct Head Wo Contrast  Result Date: 04/16/2017 CLINICAL DATA:  Headache EXAM: CT HEAD WITHOUT CONTRAST TECHNIQUE: Contiguous axial images were obtained from the base of the skull through the vertex without intravenous contrast. COMPARISON:  03/14/2011 FINDINGS: Brain: No acute territorial infarction, hemorrhage or intracranial mass is visualized. Normal ventricle size. Vascular: No hyperdense vessels.  No unexpected calcification Skull: Normal. Negative for fracture or focal lesion. Sinuses/Orbits: No acute finding. Other: None IMPRESSION: Negative.  No CT evidence for acute intracranial abnormality Electronically Signed   By: Donavan Foil M.D.   On: 04/16/2017 20:12    Procedures Procedures (including critical care time)  Medications Ordered in ED Medications  benzonatate (TESSALON) capsule 200 mg (not administered)  prochlorperazine (COMPAZINE) injection 10 mg (10 mg Intramuscular Given 04/16/17 1908)  diphenhydrAMINE (BENADRYL) injection 25 mg (25 mg Intramuscular Given 04/16/17 1907)  dexamethasone (DECADRON) injection 10 mg (10 mg Intramuscular Given 04/16/17 1907)  albuterol (PROVENTIL) (2.5 MG/3ML) 0.083% nebulizer solution 2.5 mg (2.5 mg Nebulization Given 04/16/17 1922)  ipratropium-albuterol (DUONEB) 0.5-2.5 (3) MG/3ML nebulizer solution 3 mL (3 mLs Nebulization Given 04/16/17 1922)     Initial Impression / Assessment and Plan / ED Course  I have reviewed the triage vital signs and the nursing notes.  Pertinent labs & imaging results that were available during my care of the patient were reviewed by me and considered in my medical decision making (see chart for  details).     Pt with acute bronchitis vs asthma flare, wheezing improved after neb tx but still with decreased breath sounds. Cxr reviewed, no pneumonia, no fevers.  After neb tx, pt ambulated to bathroom, then recheck of vital signs pulse ox 86%.  She was given an hour long neb tx with persistent hypoxia at rest requiring supplemental oxygen. Pt will require admission for further tx. Discussed with Dr Winfred Leeds who saw pt as well.  Of note, headache was also gone after receiving IM migraine combo of decadron, benadryl and compazine.  No neuro deficits on exam.  Call to [facility] for admission.  Final Clinical Impressions(s) / ED Diagnoses   Final diagnoses:  Acute bronchitis, unspecified organism  Acute nonintractable headache, unspecified headache type    ED Discharge Orders        Ordered    predniSONE (DELTASONE) 50 MG tablet     04/16/17 2031    albuterol (PROVENTIL) (2.5 MG/3ML) 0.083% nebulizer solution  Every 6 hours PRN     04/16/17 2031    albuterol (PROVENTIL HFA;VENTOLIN HFA) 108 (90 Base) MCG/ACT inhaler  Every 6 hours PRN     04/16/17 2031    benzonatate (TESSALON) 100 MG capsule  3 times daily PRN     04/16/17 2031       Evalee Jefferson, PA-C 04/16/17 2358    Orlie Dakin, MD 04/17/17 0040

## 2017-04-16 NOTE — ED Triage Notes (Signed)
Pt c/o HA that starts at base of skull and radiates into face & light sensitivity x 3 days. No hx of migraines.

## 2017-04-16 NOTE — H&P (Signed)
History and Physical    Lindsay Nelson QVZ:[id number] DOB: [birth year] DOA: 04/16/2017  PCP: Kathyrn Drown, MD   Patient coming from: Home.  I have personally briefly reviewed patient's old medical records in [place]  Chief Complaint: Headache.  HPI: Lindsay Nelson is a 57 y.o. female with medical history significant of asthma, hypertension, perennial allergic rhinitis who is coming to the emergency department with complaints of headache, which has been progressively worse since Friday.  She states that her pain radiates into to the left side of her face and is associated with photophobia.  She denies migraine aura, nausea, blurred vision or scotoma.  Mentions that she has had headaches in the past, similar to these many years ago.  While in the ER, the patient was found to be dyspneic and wheezing.  Her O2 saturation was in the 80s off oxygen.  She has being a heavy smoker since age 57 and currently smokes 1 pack of cigarettes per day.  Denies fever, rhinorrhea, sore throat, productive cough, hemoptysis, chest pain, palpitations, dizziness, diaphoresis, PND or orthopnea.  She occasionally gets lower extremity edema, particularly when standing during a long period of time.  Denies abdominal pain, emesis, diarrhea, melena or hematochezia.  Denies dysuria, frequency or hematuria.  No polyphagia, polyuria or blurred vision.  Denies heat or cold intolerance.  Denies pruritus or skin rashes.  ED Course: Initial vital signs temperature 99.28F, pulse 77, respirations 16, blood pressure 132/89 mmHg and O2 sat 96% on room air.  Her white count was 4.1, hemoglobin 15.1 g/dL and platelets 120. BMP showed a potassium of 3.4 millimol/L, glucose was 151 and calcium of 8.5 mg/dL.  All other electrolytes and renal function were normal.  Her magnesium was 1.8 and phosphorus 2.0 mg/dL.    Her chest radiograph shows chronic bronchitic changes.  CT scan of the head did not show any acute intracranial  normality.  Please see images and full radiology report for further detail.  Medications in the ED: The patient received supplemental oxygen, several rounds of bronchodilators, a 200 mg Tessalon Perle, Benadryl 25 mg IM, dexamethasone 10 mg IM and Compazine 10 mg IM.  Review of Systems: As per HPI otherwise 10 point review of systems negative.    Past Medical History:  Diagnosis Date  . Asthma   . Hypertension   . Perennial allergic rhinitis     Past Surgical History:  Procedure Laterality Date  . ABDOMINAL HYSTERECTOMY       reports that she has been smoking cigarettes.  She has been smoking about 1.00 pack per day. She uses smokeless tobacco. She reports that she drinks alcohol. She reports that she uses drugs. Drug: Marijuana.  No Known Allergies  Family History  Problem Relation Age of Onset  . Heart disease Mother   . Coronary artery disease Father 45       Three vessel CABG     Prior to Admission medications   Medication Sig Start Date End Date Taking? Authorizing Provider  aspirin-acetaminophen-caffeine (EXCEDRIN EXTRA STRENGTH) [phone] MG tablet Take 1-2 tablets by mouth daily as needed for headache.    Yes [provider]  cetirizine (ZYRTEC) 10 MG tablet Take 10 mg by mouth at bedtime.    Yes [provider]  fexofenadine (ALLEGRA) 180 MG tablet Take 180 mg by mouth every morning.   Yes [provider]  albuterol (PROVENTIL HFA;VENTOLIN HFA) 108 (90 Base) MCG/ACT inhaler Inhale 1-2 puffs into the lungs every  6 (six) hours as needed for wheezing or shortness of breath. 04/16/17   Idol, Almyra Free, PA-C  albuterol (PROVENTIL) (2.5 MG/3ML) 0.083% nebulizer solution Take 3 mLs (2.5 mg total) by nebulization every 6 (six) hours as needed for wheezing or shortness of breath. 04/16/17   Idol, Almyra Free, PA-C  benzonatate (TESSALON) 100 MG capsule Take 2 capsules (200 mg total) by mouth 3 (three) times daily as needed. 04/16/17   Evalee Jefferson, PA-C  predniSONE  (DELTASONE) 50 MG tablet Take one tablet daily for 3 additional days 04/18/17   Evalee Jefferson, PA-C    Physical Exam: Vitals:   04/16/17 2200 04/16/17 2230 04/16/17 2241 04/16/17 2350  BP: (!) 93/56 110/74 115/73 113/64  Pulse: 93 (!) 102 97 99  Resp: 20 (!) 21 19 20   Temp:      TempSrc:      SpO2: 96% 94% 94% 91%  Weight:      Height:        Constitutional: NAD, calm, comfortable Eyes: PERRL, lids and conjunctivae normal ENMT: Mucous membranes are moist. Posterior pharynx clear of any exudate or lesions. Neck: normal, supple, no masses, no thyromegaly Respiratory: Decreased breath sounds with wheezing and mild rhonchi bilaterally, no crackles. Normal respiratory effort. No accessory muscle use.  Cardiovascular: Regular rate and rhythm, no murmurs / rubs / gallops. No extremity edema. 2+ pedal pulses. No carotid bruits.  Abdomen: Obese, soft, no tenderness, no masses palpated. No hepatosplenomegaly. Bowel sounds positive.  Musculoskeletal: Positive clubbing, no cyanosis. Good ROM, no contractures. Normal muscle tone.  Skin: no rashes, lesions, ulcers on limited dermatological exam. Neurologic: CN 2-12 grossly intact. Sensation intact, DTR normal. Strength 5/5 in all 4.  Psychiatric: Normal judgment and insight. Alert and oriented x 4. Normal mood.    Labs on Admission: I have personally reviewed following labs and imaging studies  CBC: Recent Labs  Lab 04/16/17 2200  WBC 4.1  NEUTROABS 3.0  HGB 15.1*  HCT 47.3*  MCV 97.7  PLT 242*   Basic Metabolic Panel: Recent Labs  Lab 04/16/17 2200  NA 138  K 3.4*  CL 102  CO2 25  GLUCOSE 151*  BUN 14  CREATININE 0.96  CALCIUM 8.5*   GFR: Estimated Creatinine Clearance: 93.2 mL/min (by C-G formula based on SCr of 0.96 mg/dL). Liver Function Tests: No results for input(s): AST, ALT, ALKPHOS, BILITOT, PROT, ALBUMIN in the last 168 hours. No results for input(s): LIPASE, AMYLASE in the last 168 hours. No results for input(s):  AMMONIA in the last 168 hours. Coagulation Profile: No results for input(s): INR, PROTIME in the last 168 hours. Cardiac Enzymes: No results for input(s): CKTOTAL, CKMB, CKMBINDEX, TROPONINI in the last 168 hours. BNP (last 3 results) No results for input(s): PROBNP in the last 8760 hours. HbA1C: No results for input(s): HGBA1C in the last 72 hours. CBG: No results for input(s): GLUCAP in the last 168 hours. Lipid Profile: No results for input(s): CHOL, HDL, LDLCALC, TRIG, CHOLHDL, LDLDIRECT in the last 72 hours. Thyroid Function Tests: No results for input(s): TSH, T4TOTAL, FREET4, T3FREE, THYROIDAB in the last 72 hours. Anemia Panel: No results for input(s): VITAMINB12, FOLATE, FERRITIN, TIBC, IRON, RETICCTPCT in the last 72 hours. Urine analysis:    Component Value Date/Time   COLORURINE YELLOW 06/27/2014 0700   APPEARANCEUR HAZY (A) 06/27/2014 0700   LABSPEC 1.015 06/27/2014 0700   PHURINE 5.5 06/27/2014 0700   GLUCOSEU NEGATIVE 06/27/2014 0700   HGBUR SMALL (A) 06/27/2014 0700   BILIRUBINUR NEGATIVE  06/27/2014 0700   KETONESUR NEGATIVE 06/27/2014 0700   PROTEINUR NEGATIVE 06/27/2014 0700   UROBILINOGEN 0.2 06/27/2014 0700   NITRITE NEGATIVE 06/27/2014 0700   LEUKOCYTESUR NEGATIVE 06/27/2014 0700    Radiological Exams on Admission: Dg Chest 2 View  Result Date: 04/16/2017 CLINICAL DATA:  Shortness of breath, cough EXAM: CHEST  2 VIEW COMPARISON:  03/14/2011 FINDINGS: Peribronchial thickening and interstitial prominence, likely bronchitic changes. Heart is normal size. No confluent airspace opacities or effusions. No acute bony abnormality. IMPRESSION: Bronchitic changes. Electronically Signed   By: Rolm Baptise M.D.   On: 04/16/2017 19:55   Ct Head Wo Contrast  Result Date: 04/16/2017 CLINICAL DATA:  Headache EXAM: CT HEAD WITHOUT CONTRAST TECHNIQUE: Contiguous axial images were obtained from the base of the skull through the vertex without intravenous contrast.  COMPARISON:  03/14/2011 FINDINGS: Brain: No acute territorial infarction, hemorrhage or intracranial mass is visualized. Normal ventricle size. Vascular: No hyperdense vessels.  No unexpected calcification Skull: Normal. Negative for fracture or focal lesion. Sinuses/Orbits: No acute finding. Other: None IMPRESSION: Negative.  No CT evidence for acute intracranial abnormality Electronically Signed   By: Donavan Foil M.D.   On: 04/16/2017 20:12    EKG: Independently reviewed. Vent. rate 92 BPM PR interval * ms QRS duration 116 ms QT/QTc 455/563 ms P-R-T axes 90 2 52 Sinus rhythm Short PR interval Incomplete right bundle branch block Inferior infarct, old  Assessment/Plan Principal Problem:   COPD exacerbation (HCC) Observation/telemetry. Continue supplemental oxygen Continue with scheduled and as needed bronchodilators. Solu-Medrol 40 mg IVP every 6 hours. Smoking cessation was advised and actively encouraged. She should see a pulmonologist for PFTs as an outpatient. Encouraged to follow-up with her PCP, which she has not seen in almost 5 years.  Active Problems:   Headache Much better after Benadryl and Compazine. COPD exacerbation improvement may have helped    Hypertension Currently not on medical therapy    Hypokalemia Replaced.    Hypophosphatemia Replacing.    Abnormal EKG Check echocardiogram. Encouraged to cease tobacco use. Start low-dose aspirin. Recommended to follow-up with her PCP for outpatient stress test.   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 Smoking cessation needed.   DVT prophylaxis: Lovenox SQ. Code Status: Full code. Family Communication: Her husband was present in the ED room. Disposition Plan: Observation for COPD exacerbation treatment. Consults called:  Admission status: Observation/telemetry.   Reubin Milan MD [facility] Pager [phone].  If 7PM-7AM, please contact night-coverage [email or web address] Password  [facility]  04/16/2017, 11:57 PM

## 2017-04-17 ENCOUNTER — Encounter (HOSPITAL_COMMUNITY): Payer: Self-pay | Admitting: Internal Medicine

## 2017-04-17 DIAGNOSIS — R9431 Abnormal electrocardiogram [ECG] [EKG]: Secondary | ICD-10-CM | POA: Diagnosis present

## 2017-04-17 DIAGNOSIS — E876 Hypokalemia: Secondary | ICD-10-CM | POA: Diagnosis not present

## 2017-04-17 DIAGNOSIS — R51 Headache: Secondary | ICD-10-CM

## 2017-04-17 DIAGNOSIS — I10 Essential (primary) hypertension: Secondary | ICD-10-CM | POA: Diagnosis not present

## 2017-04-17 DIAGNOSIS — R519 Headache, unspecified: Secondary | ICD-10-CM | POA: Diagnosis present

## 2017-04-17 DIAGNOSIS — J441 Chronic obstructive pulmonary disease with (acute) exacerbation: Secondary | ICD-10-CM | POA: Diagnosis not present

## 2017-04-17 DIAGNOSIS — D751 Secondary polycythemia: Secondary | ICD-10-CM | POA: Diagnosis present

## 2017-04-17 DIAGNOSIS — Z720 Tobacco use: Secondary | ICD-10-CM | POA: Diagnosis not present

## 2017-04-17 LAB — COMPREHENSIVE METABOLIC PANEL
ALT: 14 U/L (ref 14–54)
AST: 28 U/L (ref 15–41)
Albumin: 3.4 g/dL — ABNORMAL LOW (ref 3.5–5.0)
Alkaline Phosphatase: 74 U/L (ref 38–126)
Anion gap: 10 (ref 5–15)
BUN: 14 mg/dL (ref 6–20)
CALCIUM: 8.7 mg/dL — AB (ref 8.9–10.3)
CHLORIDE: 105 mmol/L (ref 101–111)
CO2: 21 mmol/L — AB (ref 22–32)
CREATININE: 0.83 mg/dL (ref 0.44–1.00)
Glucose, Bld: 162 mg/dL — ABNORMAL HIGH (ref 65–99)
Potassium: 4.1 mmol/L (ref 3.5–5.1)
SODIUM: 136 mmol/L (ref 135–145)
Total Bilirubin: 0.2 mg/dL — ABNORMAL LOW (ref 0.3–1.2)
Total Protein: 7.4 g/dL (ref 6.5–8.1)

## 2017-04-17 LAB — CBC
HCT: 47.4 % — ABNORMAL HIGH (ref 36.0–46.0)
Hemoglobin: 15.5 g/dL — ABNORMAL HIGH (ref 12.0–15.0)
MCH: 31.6 pg (ref 26.0–34.0)
MCHC: 32.7 g/dL (ref 30.0–36.0)
MCV: 96.5 fL (ref 78.0–100.0)
PLATELETS: 122 10*3/uL — AB (ref 150–400)
RBC: 4.91 MIL/uL (ref 3.87–5.11)
RDW: 13.3 % (ref 11.5–15.5)
WBC: 2.5 10*3/uL — ABNORMAL LOW (ref 4.0–10.5)

## 2017-04-17 LAB — MAGNESIUM: MAGNESIUM: 1.8 mg/dL (ref 1.7–2.4)

## 2017-04-17 LAB — PHOSPHORUS: Phosphorus: 2 mg/dL — ABNORMAL LOW (ref 2.5–4.6)

## 2017-04-17 MED ORDER — ASPIRIN EC 81 MG PO TBEC: 81.0000 mg | DELAYED_RELEASE_TABLET | Freq: Every day | ORAL | Status: DC

## 2017-04-17 MED ORDER — IPRATROPIUM-ALBUTEROL 0.5-2.5 (3) MG/3ML IN SOLN
3.0000 mL | Freq: Four times a day (QID) | RESPIRATORY_TRACT | Status: DC
  Administered 2017-04-17: 3 mL via RESPIRATORY_TRACT
  Filled 2017-04-17: qty 3

## 2017-04-17 MED ORDER — POTASSIUM CHLORIDE ER 10 MEQ PO TBCR: 10.0000 meq | EXTENDED_RELEASE_TABLET | Freq: Every day | ORAL | 0 refills | Status: DC

## 2017-04-17 MED ORDER — METHYLPREDNISOLONE SODIUM SUCC 40 MG IJ SOLR: 40.0000 mg | Freq: Four times a day (QID) | INTRAMUSCULAR | Status: DC

## 2017-04-17 MED ORDER — MAGNESIUM SULFATE 2 GM/50ML IV SOLN: 2.0000 g | Freq: Once | INTRAVENOUS | Status: DC

## 2017-04-17 MED ORDER — ACETAMINOPHEN 650 MG RE SUPP: 650.0000 mg | Freq: Four times a day (QID) | RECTAL | Status: DC | PRN

## 2017-04-17 MED ORDER — PREDNISONE 20 MG PO TABS: ORAL_TABLET | ORAL | 0 refills | Status: DC

## 2017-04-17 MED ORDER — ALBUTEROL SULFATE (2.5 MG/3ML) 0.083% IN NEBU: 2.5000 mg | INHALATION_SOLUTION | Freq: Four times a day (QID) | RESPIRATORY_TRACT | Status: DC

## 2017-04-17 MED ORDER — ALBUTEROL SULFATE (2.5 MG/3ML) 0.083% IN NEBU: 2.5000 mg | INHALATION_SOLUTION | RESPIRATORY_TRACT | Status: DC | PRN

## 2017-04-17 MED ORDER — POTASSIUM CHLORIDE IN NACL 20-0.45 MEQ/L-% IV SOLN
INTRAVENOUS | Status: DC
  Administered 2017-04-17: 01:00:00 via INTRAVENOUS
  Filled 2017-04-17 (×3): qty 1000

## 2017-04-17 MED ORDER — ALBUTEROL SULFATE HFA 108 (90 BASE) MCG/ACT IN AERS: 1.0000 | INHALATION_SPRAY | Freq: Four times a day (QID) | RESPIRATORY_TRACT | 0 refills | Status: DC | PRN

## 2017-04-17 MED ORDER — K PHOS MONO-SOD PHOS DI & MONO 155-852-130 MG PO TABS
500.0000 mg | ORAL_TABLET | Freq: Three times a day (TID) | ORAL | Status: DC
  Filled 2017-04-17 (×3): qty 2

## 2017-04-17 MED ORDER — LORATADINE 10 MG PO TABS: 10.0000 mg | ORAL_TABLET | Freq: Every day | ORAL | Status: DC

## 2017-04-17 MED ORDER — ACETAMINOPHEN 325 MG PO TABS: 650.0000 mg | ORAL_TABLET | Freq: Four times a day (QID) | ORAL | Status: DC | PRN

## 2017-04-17 MED ORDER — ENOXAPARIN SODIUM 80 MG/0.8ML ~~LOC~~ SOLN
0.5000 mg/kg | SUBCUTANEOUS | Status: DC
  Administered 2017-04-17: 65 mg via SUBCUTANEOUS
  Filled 2017-04-17: qty 0.8

## 2017-04-17 MED ORDER — ONDANSETRON HCL 4 MG PO TABS: 4.0000 mg | ORAL_TABLET | Freq: Four times a day (QID) | ORAL | Status: DC | PRN

## 2017-04-17 MED ORDER — DOXYCYCLINE HYCLATE 100 MG PO CAPS: 100.0000 mg | ORAL_CAPSULE | Freq: Two times a day (BID) | ORAL | 0 refills | Status: AC

## 2017-04-17 MED ORDER — ONDANSETRON HCL 4 MG/2ML IJ SOLN: 4.0000 mg | Freq: Four times a day (QID) | INTRAMUSCULAR | Status: DC | PRN

## 2017-04-17 MED ORDER — NICOTINE 21 MG/24HR TD PT24: 21.0000 mg | MEDICATED_PATCH | Freq: Every day | TRANSDERMAL | 0 refills | Status: DC

## 2017-04-17 MED ORDER — NICOTINE 21 MG/24HR TD PT24: 21.0000 mg | MEDICATED_PATCH | Freq: Every day | TRANSDERMAL | Status: DC | PRN

## 2017-04-17 MED ORDER — ALBUTEROL SULFATE (2.5 MG/3ML) 0.083% IN NEBU: 2.5000 mg | INHALATION_SOLUTION | Freq: Four times a day (QID) | RESPIRATORY_TRACT | 1 refills | Status: DC | PRN

## 2017-04-17 MED ORDER — IPRATROPIUM BROMIDE 0.02 % IN SOLN: 0.5000 mg | Freq: Four times a day (QID) | RESPIRATORY_TRACT | Status: DC

## 2017-04-17 NOTE — ED Notes (Signed)
Juliann Pulse, [facility] notified of need for 1/2 NS with 5mEq K+

## 2017-04-17 NOTE — Discharge Instructions (Signed)
As discussed your chest xray is negative for pneumonia and your Ct scan is negative for serious reasons for your headache.  Take your next dose of the prednisone Wednesday morning as received a long acting steroid shot tonight (which was for your headache but will also help with your wheezing).  Rest. Get rechecked for worsening or persistent symptoms.   Follow with Primary MD  Lindsay Drown, MD  and other consultant's as instructed your Hospitalist MD  Please get a complete blood count and chemistry panel checked by your Primary MD at your next visit, and again as instructed by your Primary MD.  Get Medicines reviewed and adjusted: Please take all your medications with you for your next visit with your Primary MD  Laboratory/radiological data: Please request your Primary MD to go over all hospital tests and procedure/radiological results at the follow up, please ask your Primary MD to get all Hospital records sent to his/her office.  In some cases, they will be blood work, cultures and biopsy results pending at the time of your discharge. Please request that your primary care M.D. follows up on these results.  Also Note the following: If you experience worsening of your admission symptoms, develop shortness of breath, life threatening emergency, suicidal or homicidal thoughts you must seek medical attention immediately by calling 911 or calling your MD immediately  if symptoms less severe.  You must read complete instructions/literature along with all the possible adverse reactions/side effects for all the Medicines you take and that have been prescribed to you. Take any new Medicines after you have completely understood and accpet all the possible adverse reactions/side effects.   Do not drive when taking Pain medications or sleeping medications (Benzodaizepines)  Do not take more than prescribed Pain, Sleep and Anxiety Medications. It is not advisable to combine anxiety,sleep and pain  medications without talking with your primary care practitioner  Special Instructions: If you have smoked or chewed Tobacco  in the last 2 yrs please stop smoking, stop any regular Alcohol  and or any Recreational drug use.  Wear Seat belts while driving.  Please note: You were cared for by a hospitalist during your hospital stay. Once you are discharged, your primary care physician will handle any further medical issues. Please note that NO REFILLS for any discharge medications will be authorized once you are discharged, as it is imperative that you return to your primary care physician (or establish a relationship with a primary care physician if you do not have one) for your post hospital discharge needs so that they can reassess your need for medications and monitor your lab values.

## 2017-04-17 NOTE — Discharge Summary (Signed)
Physician Discharge Summary  [name] RJJ:[id number] DOB: [birth year] DOA: 04/16/2017  PCP: Kathyrn Drown, MD  Admit date: 04/16/2017 Discharge date: 04/17/2017  Admitted From: HOME Disposition: HOME  Recommendations for Outpatient Follow-up:  1. Follow up with PCP in 1 weeks 2. Please obtain BMP/CBC/Phosphorus  in one week 3. Please continue ongoing tobacco cessation counseling 4. Please follow up on the following pending results: final culture data  Discharge Condition: STABLE   CODE STATUS: FULL    Brief Hospitalization Summary: Please see all hospital notes, images, labs for full details of the hospitalization. HPI: Lindsay Nelson is a 57 y.o. female with medical history significant of asthma, hypertension, perennial allergic rhinitis who is coming to the emergency department with complaints of headache, which has been progressively worse since Friday.  She states that her pain radiates into to the left side of her face and is associated with photophobia.  She denies migraine aura, nausea, blurred vision or scotoma.  Mentions that she has had headaches in the past, similar to these many years ago.  While in the ER, the patient was found to be dyspneic and wheezing.  Her O2 saturation was in the 80s off oxygen.  She has being a heavy smoker since age 59 and currently smokes 1 pack of cigarettes per day.  Denies fever, rhinorrhea, sore throat, productive cough, hemoptysis, chest pain, palpitations, dizziness, diaphoresis, PND or orthopnea.  She occasionally gets lower extremity edema, particularly when standing during a long period of time.  Denies abdominal pain, emesis, diarrhea, melena or hematochezia.  Denies dysuria, frequency or hematuria.  No polyphagia, polyuria or blurred vision.  Denies heat or cold intolerance.  Denies pruritus or skin rashes.  ED Course: Initial vital signs temperature 99.38F, pulse 77, respirations 16, blood pressure 132/89 mmHg and O2 sat 96% on room  air.  Her white count was 4.1, hemoglobin 15.1 g/dL and platelets 120. BMP showed a potassium of 3.4 millimol/L, glucose was 151 and calcium of 8.5 mg/dL.  All other electrolytes and renal function were normal.  Her magnesium was 1.8 and phosphorus 2.0 mg/dL.    Her chest radiograph shows chronic bronchitic changes.  CT scan of the head did not show any acute intracranial normality.  Please see images and full radiology report for further detail.  Medications in the ED: The patient received supplemental oxygen, several rounds of bronchodilators, a 200 mg Tessalon Perle, Benadryl 25 mg IM, dexamethasone 10 mg IM and Compazine 10 mg IM.  Pt was seen in the ED and she felt much better.  Her headache resolved and she was no longer coughing and wheezing after treatments had been given.  She is asking to go home.  She will follow up with her PCP in next week for recheck.  She says that she will work to stop smoking and asked for nicotine patch prescription.  Discharge home with steroid taper, doxycycline, nebs and rescue inhaler.  I counseled her on tobacco cessation and gave her some written inforrmation for assistance with cessation.    Discharge Diagnoses:  Principal Problem:   COPD exacerbation ([place]) Active Problems:   Hypertension   Hypokalemia   Abnormal EKG   Headache   Hypophosphatemia   Polycythemia   Tobacco use  Discharge Instructions: Discharge Instructions    Call MD for:  difficulty breathing, headache or visual disturbances   Complete by:  As directed    Call MD for:  extreme fatigue   Complete by:  As directed  Call MD for:  persistant dizziness or light-headedness   Complete by:  As directed    Call MD for:  persistant nausea and vomiting   Complete by:  As directed    Call MD for:  severe uncontrolled pain   Complete by:  As directed    Increase activity slowly   Complete by:  As directed      Allergies as of 04/17/2017   No Known Allergies     Medication  List    STOP taking these medications   fexofenadine 180 MG tablet Commonly known as:  ALLEGRA     TAKE these medications   albuterol 108 (90 Base) MCG/ACT inhaler Commonly known as:  PROVENTIL HFA;VENTOLIN HFA Inhale 1-2 puffs into the lungs every 6 (six) hours as needed for wheezing or shortness of breath.   albuterol (2.5 MG/3ML) 0.083% nebulizer solution Commonly known as:  PROVENTIL Take 3 mLs (2.5 mg total) by nebulization every 6 (six) hours as needed for wheezing or shortness of breath.   cetirizine 10 MG tablet Commonly known as:  ZYRTEC Take 10 mg by mouth at bedtime.   doxycycline 100 MG capsule Commonly known as:  VIBRAMYCIN Take 1 capsule (100 mg total) by mouth 2 (two) times daily for 7 days.   EXCEDRIN EXTRA STRENGTH 250-250-65 MG tablet Generic drug:  aspirin-acetaminophen-caffeine Take 1-2 tablets by mouth daily as needed for headache.   nicotine 21 mg/24hr patch Commonly known as:  NICODERM CQ - dosed in mg/24 hours Place 1 patch (21 mg total) onto the skin daily.   potassium chloride 10 MEQ tablet Commonly known as:  K-DUR Take 1 tablet (10 mEq total) by mouth daily for 5 days.   predniSONE 20 MG tablet Commonly known as:  DELTASONE Take 3 PO QAM x3days, 2 PO QAM x3days, 1 PO QAM x3days      Follow-up Information    Luking, Elayne Snare, MD. Schedule an appointment as soon as possible for a visit in 1 week(s).   Specialty:  Family Medicine Why:  Hospital Follow Up  Contact information: [place] [address] [phone]          No Known Allergies Allergies as of 04/17/2017   No Known Allergies     Medication List    STOP taking these medications   fexofenadine 180 MG tablet Commonly known as:  ALLEGRA     TAKE these medications   albuterol 108 (90 Base) MCG/ACT inhaler Commonly known as:  PROVENTIL HFA;VENTOLIN HFA Inhale 1-2 puffs into the lungs every 6 (six) hours as needed for wheezing or shortness of  breath.   albuterol (2.5 MG/3ML) 0.083% nebulizer solution Commonly known as:  PROVENTIL Take 3 mLs (2.5 mg total) by nebulization every 6 (six) hours as needed for wheezing or shortness of breath.   cetirizine 10 MG tablet Commonly known as:  ZYRTEC Take 10 mg by mouth at bedtime.   doxycycline 100 MG capsule Commonly known as:  VIBRAMYCIN Take 1 capsule (100 mg total) by mouth 2 (two) times daily for 7 days.   EXCEDRIN EXTRA STRENGTH 250-250-65 MG tablet Generic drug:  aspirin-acetaminophen-caffeine Take 1-2 tablets by mouth daily as needed for headache.   nicotine 21 mg/24hr patch Commonly known as:  NICODERM CQ - dosed in mg/24 hours Place 1 patch (21 mg total) onto the skin daily.   potassium chloride 10 MEQ tablet Commonly known as:  K-DUR Take 1 tablet (10 mEq total) by mouth daily for  5 days.   predniSONE 20 MG tablet Commonly known as:  DELTASONE Take 3 PO QAM x3days, 2 PO QAM x3days, 1 PO QAM x3days       Procedures/Studies: Dg Chest 2 View  Result Date: 04/16/2017 CLINICAL DATA:  Shortness of breath, cough EXAM: CHEST  2 VIEW COMPARISON:  03/14/2011 FINDINGS: Peribronchial thickening and interstitial prominence, likely bronchitic changes. Heart is normal size. No confluent airspace opacities or effusions. No acute bony abnormality. IMPRESSION: Bronchitic changes. Electronically Signed   By: Rolm Baptise M.D.   On: 04/16/2017 19:55   Ct Head Wo Contrast  Result Date: 04/16/2017 CLINICAL DATA:  Headache EXAM: CT HEAD WITHOUT CONTRAST TECHNIQUE: Contiguous axial images were obtained from the base of the skull through the vertex without intravenous contrast. COMPARISON:  03/14/2011 FINDINGS: Brain: No acute territorial infarction, hemorrhage or intracranial mass is visualized. Normal ventricle size. Vascular: No hyperdense vessels.  No unexpected calcification Skull: Normal. Negative for fracture or focal lesion. Sinuses/Orbits: No acute finding. Other: None IMPRESSION:  Negative.  No CT evidence for acute intracranial abnormality Electronically Signed   By: Donavan Foil M.D.   On: 04/16/2017 20:12      Subjective: Pt breathing better and headache resolved, wants to go home.    Discharge Exam: Vitals:   04/17/17 0600 04/17/17 0708  BP: 124/83 111/69  Pulse: 76 77  Resp: 20 16  Temp:    SpO2: 98% 94%   Vitals:   04/17/17 0430 04/17/17 0530 04/17/17 0600 04/17/17 0708  BP: 123/75 121/75 124/83 111/69  Pulse: 80 79 76 77  Resp: 19 17 20 16   Temp:      TempSrc:      SpO2: 95% 95% 98% 94%  Weight:      Height:        General: Pt is alert, awake, not in acute distress Cardiovascular: RRR, S1/S2 +, no rubs, no gallops Respiratory: good air movement, no increased WOB Abdominal: Soft, NT, ND, bowel sounds + Extremities: no edema, no cyanosis   The results of significant diagnostics from this hospitalization (including imaging, microbiology, ancillary and laboratory) are listed below for reference.     Microbiology: No results found for this or any previous visit (from the past 240 hour(s)).   Labs: BNP (last 3 results) No results for input(s): BNP in the last 8760 hours. Basic Metabolic Panel: Recent Labs  Lab 04/16/17 2200 04/17/17 0657  NA 138 136  K 3.4* 4.1  CL 102 105  CO2 25 21*  GLUCOSE 151* 162*  BUN 14 14  CREATININE 0.96 0.83  CALCIUM 8.5* 8.7*  MG 1.8  --   PHOS 2.0*  --    Liver Function Tests: Recent Labs  Lab 04/17/17 0657  AST 28  ALT 14  ALKPHOS 74  BILITOT 0.2*  PROT 7.4  ALBUMIN 3.4*   No results for input(s): LIPASE, AMYLASE in the last 168 hours. No results for input(s): AMMONIA in the last 168 hours. CBC: Recent Labs  Lab 04/16/17 2200 04/17/17 0657  WBC 4.1 2.5*  NEUTROABS 3.0  --   HGB 15.1* 15.5*  HCT 47.3* 47.4*  MCV 97.7 96.5  PLT 120* 122*   Cardiac Enzymes: No results for input(s): CKTOTAL, CKMB, CKMBINDEX, TROPONINI in the last 168 hours. BNP: Invalid input(s):  POCBNP CBG: No results for input(s): GLUCAP in the last 168 hours. D-Dimer No results for input(s): DDIMER in the last 72 hours. Hgb A1c No results for input(s): HGBA1C in the last 72  hours. Lipid Profile No results for input(s): CHOL, HDL, LDLCALC, TRIG, CHOLHDL, LDLDIRECT in the last 72 hours. Thyroid function studies No results for input(s): TSH, T4TOTAL, T3FREE, THYROIDAB in the last 72 hours.  Invalid input(s): FREET3 Anemia work up No results for input(s): VITAMINB12, FOLATE, FERRITIN, TIBC, IRON, RETICCTPCT in the last 72 hours. Urinalysis    Component Value Date/Time   COLORURINE YELLOW 06/27/2014 0700   APPEARANCEUR HAZY (A) 06/27/2014 0700   LABSPEC 1.015 06/27/2014 0700   PHURINE 5.5 06/27/2014 0700   GLUCOSEU NEGATIVE 06/27/2014 0700   HGBUR SMALL (A) 06/27/2014 0700   BILIRUBINUR NEGATIVE 06/27/2014 0700   KETONESUR NEGATIVE 06/27/2014 0700   PROTEINUR NEGATIVE 06/27/2014 0700   UROBILINOGEN 0.2 06/27/2014 0700   NITRITE NEGATIVE 06/27/2014 0700   LEUKOCYTESUR NEGATIVE 06/27/2014 0700   Sepsis Labs Invalid input(s): PROCALCITONIN,  WBC,  LACTICIDVEN Microbiology No results found for this or any previous visit (from the past 240 hour(s)).  Time coordinating discharge:   SIGNED:  Irwin Brakeman, MD  [facility] 04/17/2017, 8:00 AM Pager [phone]  If 7PM-7AM, please contact night-coverage [email or web address] Password [email or web address]

## 2017-04-26 ENCOUNTER — Ambulatory Visit: Payer: Self-pay | Admitting: Family Medicine

## 2017-05-10 ENCOUNTER — Ambulatory Visit: Payer: Self-pay | Admitting: Family Medicine

## 2020-05-27 DIAGNOSIS — Z029 Encounter for administrative examinations, unspecified: Secondary | ICD-10-CM

## 2021-08-12 ENCOUNTER — Emergency Department (HOSPITAL_COMMUNITY): Payer: BC Managed Care – PPO

## 2021-08-12 ENCOUNTER — Encounter (HOSPITAL_COMMUNITY): Payer: Self-pay

## 2021-08-12 ENCOUNTER — Other Ambulatory Visit: Payer: Self-pay

## 2021-08-12 ENCOUNTER — Inpatient Hospital Stay (HOSPITAL_COMMUNITY)
Admission: EM | Admit: 2021-08-12 | Discharge: 2021-08-14 | DRG: 189 | Disposition: A | Discharge status: Home / Self Care | Payer: BC Managed Care – PPO | Attending: Family Medicine | Admitting: Family Medicine

## 2021-08-12 DIAGNOSIS — J439 Emphysema, unspecified: Secondary | ICD-10-CM | POA: Diagnosis present

## 2021-08-12 DIAGNOSIS — Z87891 Personal history of nicotine dependence: Secondary | ICD-10-CM

## 2021-08-12 DIAGNOSIS — J9600 Acute respiratory failure, unspecified whether with hypoxia or hypercapnia: Secondary | ICD-10-CM | POA: Diagnosis present

## 2021-08-12 DIAGNOSIS — D696 Thrombocytopenia, unspecified: Secondary | ICD-10-CM | POA: Diagnosis present

## 2021-08-12 DIAGNOSIS — R519 Headache, unspecified: Secondary | ICD-10-CM | POA: Diagnosis present

## 2021-08-12 DIAGNOSIS — R5381 Other malaise: Secondary | ICD-10-CM | POA: Diagnosis present

## 2021-08-12 DIAGNOSIS — Z6841 Body Mass Index (BMI) 40.0 and over, adult: Secondary | ICD-10-CM

## 2021-08-12 DIAGNOSIS — M7989 Other specified soft tissue disorders: Secondary | ICD-10-CM | POA: Diagnosis present

## 2021-08-12 DIAGNOSIS — D3502 Benign neoplasm of left adrenal gland: Secondary | ICD-10-CM | POA: Diagnosis present

## 2021-08-12 DIAGNOSIS — J441 Chronic obstructive pulmonary disease with (acute) exacerbation: Principal | ICD-10-CM

## 2021-08-12 DIAGNOSIS — J9601 Acute respiratory failure with hypoxia: Secondary | ICD-10-CM | POA: Diagnosis present

## 2021-08-12 DIAGNOSIS — Z720 Tobacco use: Secondary | ICD-10-CM | POA: Diagnosis present

## 2021-08-12 DIAGNOSIS — I10 Essential (primary) hypertension: Secondary | ICD-10-CM | POA: Diagnosis present

## 2021-08-12 DIAGNOSIS — Z20822 Contact with and (suspected) exposure to covid-19: Secondary | ICD-10-CM | POA: Diagnosis present

## 2021-08-12 DIAGNOSIS — Z8249 Family history of ischemic heart disease and other diseases of the circulatory system: Secondary | ICD-10-CM

## 2021-08-12 DIAGNOSIS — Z90710 Acquired absence of both cervix and uterus: Secondary | ICD-10-CM

## 2021-08-12 LAB — BASIC METABOLIC PANEL
Anion gap: 8 (ref 5–15)
BUN: 12 mg/dL (ref 8–23)
CO2: 27 mmol/L (ref 22–32)
Calcium: 8.6 mg/dL — ABNORMAL LOW (ref 8.9–10.3)
Chloride: 102 mmol/L (ref 98–111)
Creatinine, Ser: 1.01 mg/dL — ABNORMAL HIGH (ref 0.44–1.00)
GFR, Estimated: >60 mL/min (ref >60)
Glucose, Bld: 134 mg/dL — ABNORMAL HIGH (ref 70–99)
Potassium: 3.7 mmol/L (ref 3.5–5.1)
Sodium: 137 mmol/L (ref 135–145)

## 2021-08-12 LAB — BRAIN NATRIURETIC PEPTIDE: B Natriuretic Peptide: 147 pg/mL — ABNORMAL HIGH (ref 0.0–100.0)

## 2021-08-12 LAB — URINALYSIS, ROUTINE W REFLEX MICROSCOPIC
Bacteria, UA: NONE SEEN
Bilirubin Urine: NEGATIVE
Glucose, UA: NEGATIVE mg/dL
Ketones, ur: 5 mg/dL — AB
Leukocytes,Ua: NEGATIVE
Nitrite: NEGATIVE
Protein, ur: 100 mg/dL — AB
Specific Gravity, Urine: 1.024 (ref 1.005–1.030)
pH: 5 (ref 5.0–8.0)

## 2021-08-12 LAB — CBC WITH DIFFERENTIAL/PLATELET
Abs Immature Granulocytes: 0.02 10*3/uL (ref 0.00–0.07)
Basophils Absolute: 0 10*3/uL (ref 0.0–0.1)
Basophils Relative: 1 %
Eosinophils Absolute: 0 10*3/uL (ref 0.0–0.5)
Eosinophils Relative: 0 %
HCT: 46 % (ref 36.0–46.0)
Hemoglobin: 14.9 g/dL (ref 12.0–15.0)
Immature Granulocytes: 0 %
Lymphocytes Relative: 15 %
Lymphs Abs: 0.8 10*3/uL (ref 0.7–4.0)
MCH: 31.8 pg (ref 26.0–34.0)
MCHC: 32.4 g/dL (ref 30.0–36.0)
MCV: 98.1 fL (ref 80.0–100.0)
Monocytes Absolute: 0.9 10*3/uL (ref 0.1–1.0)
Monocytes Relative: 16 %
Neutro Abs: 3.8 10*3/uL (ref 1.7–7.7)
Neutrophils Relative %: 68 %
Platelets: 149 10*3/uL — ABNORMAL LOW (ref 150–400)
RBC: 4.69 MIL/uL (ref 3.87–5.11)
RDW: 13.7 % (ref 11.5–15.5)
WBC: 5.6 10*3/uL (ref 4.0–10.5)
nRBC: 0 % (ref 0.0–0.2)

## 2021-08-12 LAB — SARS CORONAVIRUS 2 BY RT PCR: SARS Coronavirus 2 by RT PCR: NEGATIVE

## 2021-08-12 MED ORDER — IPRATROPIUM-ALBUTEROL 0.5-2.5 (3) MG/3ML IN SOLN
3.0000 mL | Freq: Once | RESPIRATORY_TRACT | Status: AC
  Administered 2021-08-12: 3 mL via RESPIRATORY_TRACT
  Filled 2021-08-12: qty 3

## 2021-08-12 MED ORDER — ONDANSETRON HCL 4 MG PO TABS: 4.0000 mg | ORAL_TABLET | Freq: Four times a day (QID) | ORAL | Status: DC | PRN

## 2021-08-12 MED ORDER — MENTHOL 3 MG MT LOZG
1.0000 | LOZENGE | OROMUCOSAL | Status: DC | PRN
  Administered 2021-08-13: 3 mg via ORAL
  Filled 2021-08-12: qty 9

## 2021-08-12 MED ORDER — NICOTINE 21 MG/24HR TD PT24
21.0000 mg | MEDICATED_PATCH | Freq: Every day | TRANSDERMAL | Status: DC
  Administered 2021-08-12 – 2021-08-14 (×3): 21 mg via TRANSDERMAL
  Filled 2021-08-12 (×3): qty 1

## 2021-08-12 MED ORDER — ACETAMINOPHEN 325 MG PO TABS
650.0000 mg | ORAL_TABLET | Freq: Four times a day (QID) | ORAL | Status: DC | PRN
  Administered 2021-08-14: 650 mg via ORAL
  Filled 2021-08-12: qty 2

## 2021-08-12 MED ORDER — ACETAMINOPHEN 650 MG RE SUPP: 650.0000 mg | Freq: Four times a day (QID) | RECTAL | Status: DC | PRN

## 2021-08-12 MED ORDER — IOHEXOL 350 MG/ML SOLN
100.0000 mL | Freq: Once | INTRAVENOUS | Status: AC | PRN
  Administered 2021-08-12: 100 mL via INTRAVENOUS

## 2021-08-12 MED ORDER — ONDANSETRON HCL 4 MG/2ML IJ SOLN
4.0000 mg | Freq: Once | INTRAMUSCULAR | Status: AC
  Administered 2021-08-12: 4 mg via INTRAVENOUS
  Filled 2021-08-12: qty 2

## 2021-08-12 MED ORDER — MORPHINE SULFATE (PF) 4 MG/ML IV SOLN
4.0000 mg | Freq: Once | INTRAVENOUS | Status: AC
  Administered 2021-08-12: 4 mg via INTRAVENOUS
  Filled 2021-08-12: qty 1

## 2021-08-12 MED ORDER — BUDESONIDE 0.25 MG/2ML IN SUSP
0.2500 mg | Freq: Two times a day (BID) | RESPIRATORY_TRACT | Status: DC
Start: 2021-08-12 — End: 2021-08-14
  Administered 2021-08-12 – 2021-08-14 (×4): 0.25 mg via RESPIRATORY_TRACT
  Filled 2021-08-12 (×4): qty 2

## 2021-08-12 MED ORDER — SODIUM CHLORIDE 0.9% FLUSH
3.0000 mL | Freq: Two times a day (BID) | INTRAVENOUS | Status: DC
  Administered 2021-08-13 – 2021-08-14 (×4): 3 mL via INTRAVENOUS

## 2021-08-12 MED ORDER — METHYLPREDNISOLONE SODIUM SUCC 40 MG IJ SOLR
40.0000 mg | Freq: Two times a day (BID) | INTRAMUSCULAR | Status: DC
  Administered 2021-08-12 – 2021-08-14 (×4): 40 mg via INTRAVENOUS
  Filled 2021-08-12 (×4): qty 1

## 2021-08-12 MED ORDER — BUTALBITAL-APAP-CAFFEINE 50-325-40 MG PO TABS
2.0000 | ORAL_TABLET | ORAL | Status: DC | PRN
  Administered 2021-08-12 – 2021-08-13 (×2): 2 via ORAL
  Filled 2021-08-12 (×2): qty 2

## 2021-08-12 MED ORDER — SODIUM CHLORIDE 0.9 % IV SOLN: 250.0000 mL | INTRAVENOUS | Status: DC | PRN

## 2021-08-12 MED ORDER — SODIUM CHLORIDE 0.9% FLUSH: 3.0000 mL | INTRAVENOUS | Status: DC | PRN

## 2021-08-12 MED ORDER — ONDANSETRON HCL 4 MG/2ML IJ SOLN: 4.0000 mg | Freq: Four times a day (QID) | INTRAMUSCULAR | Status: DC | PRN

## 2021-08-12 MED ORDER — IPRATROPIUM-ALBUTEROL 0.5-2.5 (3) MG/3ML IN SOLN
3.0000 mL | Freq: Four times a day (QID) | RESPIRATORY_TRACT | Status: DC
  Administered 2021-08-12 – 2021-08-14 (×6): 3 mL via RESPIRATORY_TRACT
  Filled 2021-08-12 (×6): qty 3

## 2021-08-13 DIAGNOSIS — I10 Essential (primary) hypertension: Secondary | ICD-10-CM | POA: Diagnosis present

## 2021-08-13 DIAGNOSIS — Z20822 Contact with and (suspected) exposure to covid-19: Secondary | ICD-10-CM | POA: Diagnosis present

## 2021-08-13 DIAGNOSIS — Z90710 Acquired absence of both cervix and uterus: Secondary | ICD-10-CM | POA: Diagnosis not present

## 2021-08-13 DIAGNOSIS — J9601 Acute respiratory failure with hypoxia: Secondary | ICD-10-CM | POA: Diagnosis present

## 2021-08-13 DIAGNOSIS — R5381 Other malaise: Secondary | ICD-10-CM | POA: Diagnosis present

## 2021-08-13 DIAGNOSIS — Z87891 Personal history of nicotine dependence: Secondary | ICD-10-CM | POA: Diagnosis not present

## 2021-08-13 DIAGNOSIS — D3502 Benign neoplasm of left adrenal gland: Secondary | ICD-10-CM | POA: Diagnosis present

## 2021-08-13 DIAGNOSIS — D696 Thrombocytopenia, unspecified: Secondary | ICD-10-CM | POA: Diagnosis present

## 2021-08-13 DIAGNOSIS — Z6841 Body Mass Index (BMI) 40.0 and over, adult: Secondary | ICD-10-CM | POA: Diagnosis not present

## 2021-08-13 DIAGNOSIS — J9600 Acute respiratory failure, unspecified whether with hypoxia or hypercapnia: Secondary | ICD-10-CM | POA: Diagnosis present

## 2021-08-13 DIAGNOSIS — J439 Emphysema, unspecified: Secondary | ICD-10-CM | POA: Diagnosis present

## 2021-08-13 DIAGNOSIS — Z8249 Family history of ischemic heart disease and other diseases of the circulatory system: Secondary | ICD-10-CM | POA: Diagnosis not present

## 2021-08-13 DIAGNOSIS — R519 Headache, unspecified: Secondary | ICD-10-CM | POA: Diagnosis present

## 2021-08-13 DIAGNOSIS — M7989 Other specified soft tissue disorders: Secondary | ICD-10-CM | POA: Diagnosis present

## 2021-08-13 LAB — CBC
HCT: 45.4 % (ref 36.0–46.0)
Hemoglobin: 14.3 g/dL (ref 12.0–15.0)
MCH: 31.8 pg (ref 26.0–34.0)
MCHC: 31.5 g/dL (ref 30.0–36.0)
MCV: 101.1 fL — ABNORMAL HIGH (ref 80.0–100.0)
Platelets: 138 10*3/uL — ABNORMAL LOW (ref 150–400)
RBC: 4.49 MIL/uL (ref 3.87–5.11)
RDW: 13.8 % (ref 11.5–15.5)
WBC: 5.2 10*3/uL (ref 4.0–10.5)
nRBC: 0 % (ref 0.0–0.2)

## 2021-08-13 LAB — BASIC METABOLIC PANEL
Anion gap: 7 (ref 5–15)
BUN: 18 mg/dL (ref 8–23)
CO2: 29 mmol/L (ref 22–32)
Calcium: 8.3 mg/dL — ABNORMAL LOW (ref 8.9–10.3)
Chloride: 101 mmol/L (ref 98–111)
Creatinine, Ser: 1.15 mg/dL — ABNORMAL HIGH (ref 0.44–1.00)
GFR, Estimated: 54 mL/min — ABNORMAL LOW (ref >60)
Glucose, Bld: 174 mg/dL — ABNORMAL HIGH (ref 70–99)
Potassium: 4.4 mmol/L (ref 3.5–5.1)
Sodium: 137 mmol/L (ref 135–145)

## 2021-08-13 LAB — HIV ANTIBODY (ROUTINE TESTING W REFLEX): HIV Screen 4th Generation wRfx: NONREACTIVE

## 2021-08-13 LAB — MAGNESIUM: Magnesium: 2.1 mg/dL (ref 1.7–2.4)

## 2021-08-13 MED ORDER — GUAIFENESIN-DM 100-10 MG/5ML PO SYRP
10.0000 mL | ORAL_SOLUTION | Freq: Three times a day (TID) | ORAL | Status: DC
  Administered 2021-08-13 – 2021-08-14 (×4): 10 mL via ORAL
  Filled 2021-08-13 (×4): qty 10

## 2021-08-13 MED ORDER — METHYLPREDNISOLONE SODIUM SUCC 40 MG IJ SOLR
40.0000 mg | Freq: Once | INTRAMUSCULAR | Status: AC
  Administered 2021-08-13: 40 mg via INTRAVENOUS
  Filled 2021-08-13: qty 1

## 2021-08-13 MED ORDER — POLYETHYLENE GLYCOL 3350 17 G PO PACK
17.0000 g | PACK | Freq: Every day | ORAL | Status: DC | PRN
  Administered 2021-08-12 – 2021-08-13 (×2): 17 g via ORAL
  Filled 2021-08-13: qty 1

## 2021-08-13 MED ORDER — PNEUMOCOCCAL 20-VAL CONJ VACC 0.5 ML IM SUSY: 0.5000 mL | PREFILLED_SYRINGE | INTRAMUSCULAR | Status: DC | PRN

## 2021-08-13 MED ORDER — GUAIFENESIN ER 600 MG PO TB12
600.0000 mg | ORAL_TABLET | Freq: Two times a day (BID) | ORAL | Status: DC
  Administered 2021-08-13 (×2): 600 mg via ORAL
  Filled 2021-08-13 (×2): qty 1

## 2021-08-14 ENCOUNTER — Other Ambulatory Visit: Payer: Self-pay | Admitting: Family Medicine

## 2021-08-14 DIAGNOSIS — J441 Chronic obstructive pulmonary disease with (acute) exacerbation: Secondary | ICD-10-CM

## 2021-08-14 DIAGNOSIS — J9601 Acute respiratory failure with hypoxia: Secondary | ICD-10-CM | POA: Diagnosis not present

## 2021-08-14 MED ORDER — FLEET ENEMA 7-19 GM/118ML RE ENEM
1.0000 | ENEMA | Freq: Once | RECTAL | Status: AC
  Administered 2021-08-14: 1 via RECTAL

## 2021-08-14 MED ORDER — NICOTINE 21 MG/24HR TD PT24: 21.0000 mg | MEDICATED_PATCH | Freq: Every day | TRANSDERMAL | 3 refills | Status: DC

## 2021-08-14 MED ORDER — MAGNESIUM HYDROXIDE 400 MG/5ML PO SUSP
30.0000 mL | Freq: Every day | ORAL | Status: DC
  Administered 2021-08-14: 30 mL via ORAL
  Filled 2021-08-14: qty 30

## 2021-08-14 MED ORDER — IPRATROPIUM-ALBUTEROL 0.5-2.5 (3) MG/3ML IN SOLN: 3.0000 mL | Freq: Four times a day (QID) | RESPIRATORY_TRACT | 1 refills | Status: DC

## 2021-08-14 MED ORDER — GUAIFENESIN-DM 100-10 MG/5ML PO SYRP: 10.0000 mL | ORAL_SOLUTION | Freq: Three times a day (TID) | ORAL | 0 refills | Status: DC

## 2021-08-14 MED ORDER — METHYLPREDNISOLONE 4 MG PO TBPK: ORAL_TABLET | ORAL | 0 refills | Status: DC

## 2021-08-14 MED ORDER — POLYETHYLENE GLYCOL 3350 17 G PO PACK: 17.0000 g | PACK | Freq: Every day | ORAL | 0 refills | Status: DC | PRN

## 2021-08-14 MED ORDER — ALBUTEROL SULFATE HFA 108 (90 BASE) MCG/ACT IN AERS: 1.0000 | INHALATION_SPRAY | Freq: Four times a day (QID) | RESPIRATORY_TRACT | 2 refills | Status: DC | PRN

## 2021-08-14 MED ORDER — BUDESONIDE 0.25 MG/2ML IN SUSP: 0.2500 mg | Freq: Two times a day (BID) | RESPIRATORY_TRACT | 5 refills | Status: DC

## 2021-08-14 NOTE — Progress Notes (Signed)
Milk of mag given with warmed prune juice for constipation, no relief. Fleet enema given followed by BM with relief for patient.

## 2021-08-14 NOTE — Progress Notes (Signed)
Discharge instructions given to patient and husband. Patient verbalized understanding.

## 2021-08-22 ENCOUNTER — Encounter: Payer: Self-pay | Admitting: Family Medicine

## 2021-08-22 ENCOUNTER — Telehealth: Payer: Self-pay

## 2021-08-22 ENCOUNTER — Ambulatory Visit: Payer: BC Managed Care – PPO | Admitting: Family Medicine

## 2021-08-22 VITALS — BP 130/70 | HR 76 | Temp 97.8°F | Wt 311.4 lb

## 2021-08-22 DIAGNOSIS — R739 Hyperglycemia, unspecified: Secondary | ICD-10-CM | POA: Diagnosis not present

## 2021-08-22 DIAGNOSIS — R7989 Other specified abnormal findings of blood chemistry: Secondary | ICD-10-CM

## 2021-08-22 DIAGNOSIS — D7589 Other specified diseases of blood and blood-forming organs: Secondary | ICD-10-CM | POA: Diagnosis not present

## 2021-08-22 DIAGNOSIS — Z13220 Encounter for screening for lipoid disorders: Secondary | ICD-10-CM | POA: Diagnosis not present

## 2021-08-22 DIAGNOSIS — Z1231 Encounter for screening mammogram for malignant neoplasm of breast: Secondary | ICD-10-CM

## 2021-08-22 DIAGNOSIS — Z87891 Personal history of nicotine dependence: Secondary | ICD-10-CM

## 2021-08-22 DIAGNOSIS — J439 Emphysema, unspecified: Secondary | ICD-10-CM | POA: Diagnosis not present

## 2021-08-22 DIAGNOSIS — J449 Chronic obstructive pulmonary disease, unspecified: Secondary | ICD-10-CM | POA: Insufficient documentation

## 2021-08-22 DIAGNOSIS — J9611 Chronic respiratory failure with hypoxia: Secondary | ICD-10-CM

## 2021-08-22 DIAGNOSIS — J9610 Chronic respiratory failure, unspecified whether with hypoxia or hypercapnia: Secondary | ICD-10-CM | POA: Insufficient documentation

## 2021-08-22 DIAGNOSIS — I10 Essential (primary) hypertension: Secondary | ICD-10-CM

## 2021-08-22 NOTE — Progress Notes (Signed)
Subjective:  Patient ID: Lindsay Nelson, female    DOB: Jul 16, 1960  Age: 61 y.o. MRN: [id number]  CC: Chief Complaint  Patient presents with   New Patient (Initial Visit)    Establish care.    HPI:  61 year old female with history of tobacco abuse, COPD now on oxygen (2 L nasal cannula), hypertension, morbid obesity presents to establish care.  Patient has not had a PCP in many years.  Recently admitted to the hospital at the end of June regarding her COPD.  She is now on oxygen.  Has upcoming appointment with pulmonology.  Patient states that she would like to keep her appointment but would like her care to be transferred to [facility] when she has been seen.  She is compliant with budesonide.  She uses albuterol as needed.  She is no longer smoking.  She is short of breath with minimal activity.  Chart reflects that she has a history of hypertension.  She is currently not on any medication for this and her blood pressure is 130/70 today.  Patient needs lab work today.  Patient Active Problem List   Diagnosis Date Noted   Chronic respiratory failure ([place]) 08/22/2021   History of tobacco abuse 08/22/2021   COPD (chronic obstructive pulmonary disease) ([place]) 08/22/2021   Morbid obesity ([place]) 08/22/2021   Hypertension 04/16/2017    Social Hx   Social History   Socioeconomic History   Marital status: Married    Spouse name: Not on file   Number of children: Not on file   Years of education: Not on file   Highest education level: Not on file  Occupational History   Not on file  Tobacco Use   Smoking status: Every Day    Packs/day: 1.00    Types: Cigarettes   Smokeless tobacco: Current  Vaping Use   Vaping Use: Never used  Substance and Sexual Activity   Alcohol use: Yes    Comment: occasionally    Drug use: Yes    Types: Marijuana    Comment: last use 04/14/2017   Sexual activity: Yes  Other Topics Concern   Not on file  Social History Narrative   Not on file    Social Determinants of Health   Financial Resource Strain: Not on file  Food Insecurity: Not on file  Transportation Needs: Not on file  Physical Activity: Not on file  Stress: Not on file  Social Connections: Not on file    Review of Systems  Constitutional:  Negative for fever.  Respiratory:  Positive for shortness of breath.    Objective:  BP 130/70   Pulse 76   Temp 97.8 F (36.6 C) (Oral)   Wt (!) 311 lb 6.4 oz (141.3 kg)   SpO2 100% Comment: on 2L  BMI 51.82 kg/m      08/22/2021    8:45 AM 08/14/2021    6:25 AM 08/13/2021    2:11 PM  BP/Weight  Systolic BP 570 177 939  Diastolic BP 70 72 72  Wt. (Lbs) 311.4    BMI 51.82 kg/m2      Physical Exam Vitals and nursing note reviewed.  Constitutional:      Comments: Chronically ill-appearing.  Morbidly obese.  HENT:     Head: Normocephalic and atraumatic.     Nose: Nose normal.  Eyes:     General:        Right eye: No discharge.        Left eye: No discharge.  Conjunctiva/sclera: Conjunctivae normal.  Cardiovascular:     Rate and Rhythm: Normal rate and regular rhythm.  Pulmonary:     Effort: Pulmonary effort is normal.     Breath sounds: Wheezing present.  Neurological:     General: No focal deficit present.     Mental Status: She is alert.  Psychiatric:     Comments: Flat affect, depressed mood.     Lab Results  Component Value Date   WBC 5.2 08/13/2021   HGB 14.3 08/13/2021   HCT 45.4 08/13/2021   PLT 138 (L) 08/13/2021   GLUCOSE 174 (H) 08/13/2021   ALT 14 04/17/2017   AST 28 04/17/2017   NA 137 08/13/2021   K 4.4 08/13/2021   CL 101 08/13/2021   CREATININE 1.15 (H) 08/13/2021   BUN 18 08/13/2021   CO2 29 08/13/2021     Assessment & Plan:   Problem List Items Addressed This Visit       Cardiovascular and Mediastinum   Hypertension    BP well controlled here.  We will continue to monitor.  No pharmacotherapy at this time.        Respiratory   Chronic respiratory failure  (HCC)    Continue oxygen therapy.  Continue budesonide.  Has upcoming appointment with pulmonology.      COPD (chronic obstructive pulmonary disease) (HCC) - Primary    Continue budesonide.  Albuterol as needed.      Relevant Medications   Fexofenadine HCl (ALLEGRA ALLERGY PO)   Pseudoephedrine-guaiFENesin (MUCINEX D PO)   diphenhydrAMINE (BENADRYL ALLERGY) 25 MG tablet     Other   History of tobacco abuse   Morbid obesity ([place])   Other Visit Diagnoses     Macrocytosis       Relevant Orders   CBC   Vitamin B12   Folate   Screening, lipid       Relevant Orders   Lipid panel   Hyperglycemia       Relevant Orders   Hemoglobin A1c   Elevated serum creatinine       Relevant Orders   CMP14+EGFR   Encounter for screening mammogram for malignant neoplasm of breast       Relevant Orders   MM 3D SCREEN BREAST BILATERAL      Follow-up:  Return in about 3 months (around 11/22/2021).  [place]

## 2021-08-22 NOTE — Patient Instructions (Signed)
Labs today.  Continue your medications.  I have placed a referral to Pulmonology Linna Hoff).  Call [phone] to schedule Mammogram.  Follow up in 3 months (may be sooner based on lab results).  Take care  Dr. Lacinda Axon

## 2021-08-22 NOTE — Assessment & Plan Note (Signed)
BP well controlled here.  We will continue to monitor.  No pharmacotherapy at this time.

## 2021-08-22 NOTE — Assessment & Plan Note (Signed)
Continue oxygen therapy.  Continue budesonide.  Has upcoming appointment with pulmonology.

## 2021-08-22 NOTE — Assessment & Plan Note (Signed)
Continue budesonide.  Albuterol as needed.

## 2021-08-22 NOTE — Telephone Encounter (Signed)
Caller name:Hillarie Merryl Hacker   On DPR? :No   Call back [phone] Chrissie Noa)   Provider they see: Lacinda Axon   Reason for call:Pt come by and dropped off DM[employer] disability parking pass to be signed and completed. She asked could we call her husband when completed her phone is not working at the time

## 2021-08-23 LAB — CMP14+EGFR
ALT: 14 IU/L (ref 0–32)
AST: 10 IU/L (ref 0–40)
Albumin/Globulin Ratio: 1.3 (ref 1.2–2.2)
Albumin: 3.7 g/dL — ABNORMAL LOW (ref 3.8–4.8)
Alkaline Phosphatase: 97 IU/L (ref 44–121)
BUN/Creatinine Ratio: 16 (ref 12–28)
BUN: 14 mg/dL (ref 8–27)
Bilirubin Total: 0.3 mg/dL (ref 0.0–1.2)
CO2: 32 mmol/L — ABNORMAL HIGH (ref 20–29)
Calcium: 9 mg/dL (ref 8.7–10.3)
Chloride: 103 mmol/L (ref 96–106)
Creatinine, Ser: 0.89 mg/dL (ref 0.57–1.00)
Globulin, Total: 2.9 g/dL (ref 1.5–4.5)
Glucose: 97 mg/dL (ref 70–99)
Potassium: 5.1 mmol/L (ref 3.5–5.2)
Sodium: 147 mmol/L — ABNORMAL HIGH (ref 134–144)
Total Protein: 6.6 g/dL (ref 6.0–8.5)
eGFR: 73 mL/min/{1.73_m2} (ref >59)

## 2021-08-23 LAB — LIPID PANEL
Chol/HDL Ratio: 3.1 ratio (ref 0.0–4.4)
Cholesterol, Total: 181 mg/dL (ref 100–199)
HDL: 59 mg/dL (ref >39)
LDL Chol Calc (NIH): 102 mg/dL — ABNORMAL HIGH (ref 0–99)
Triglycerides: 112 mg/dL (ref 0–149)
VLDL Cholesterol Cal: 20 mg/dL (ref 5–40)

## 2021-08-23 LAB — CBC
Hematocrit: 45.3 % (ref 34.0–46.6)
Hemoglobin: 14.7 g/dL (ref 11.1–15.9)
MCH: 31.5 pg (ref 26.6–33.0)
MCHC: 32.5 g/dL (ref 31.5–35.7)
MCV: 97 fL (ref 79–97)
Platelets: 177 10*3/uL (ref 150–450)
RBC: 4.67 x10E6/uL (ref 3.77–5.28)
RDW: 12.1 % (ref 11.7–15.4)
WBC: 5.3 10*3/uL (ref 3.4–10.8)

## 2021-08-23 LAB — VITAMIN B12: Vitamin B-12: 449 pg/mL (ref 232–1245)

## 2021-08-23 LAB — FOLATE: Folate: 4.1 ng/mL (ref >3.0)

## 2021-08-23 LAB — HEMOGLOBIN A1C
Est. average glucose Bld gHb Est-mCnc: 131 mg/dL
Hgb A1c MFr Bld: 6.2 % — ABNORMAL HIGH (ref 4.8–5.6)

## 2021-08-30 NOTE — Progress Notes (Unsigned)
Synopsis: Referred for COPD by Coral Spikes, DO  Subjective:   PATIENT ID: Lindsay Nelson GENDER: female DOB: 12-04-1960, MRN: [id number]  No chief complaint on file.  61yF with history of COPD, HTN, smoking, AR referred for COPD  Recent admission for AECOPD 6/23-25 discharged on O2 2L. She is on pulmicort nebs.   Otherwise pertinent review of systems is negative.  Past Medical History:  Diagnosis Date   COPD (chronic obstructive pulmonary disease) (HCC)    Hypertension    Perennial allergic rhinitis    Tobacco use      Family History  Problem Relation Age of Onset   Heart disease Mother    Coronary artery disease Father 17       Three vessel CABG      Past Surgical History:  Procedure Laterality Date   ABDOMINAL HYSTERECTOMY      Social History   Socioeconomic History   Marital status: Married    Spouse name: Not on file   Number of children: Not on file   Years of education: Not on file   Highest education level: Not on file  Occupational History   Not on file  Tobacco Use   Smoking status: Every Day    Packs/day: 1.00    Types: Cigarettes   Smokeless tobacco: Current  Vaping Use   Vaping Use: Never used  Substance and Sexual Activity   Alcohol use: Yes    Comment: occasionally    Drug use: Yes    Types: Marijuana    Comment: last use 04/14/2017   Sexual activity: Yes  Other Topics Concern   Not on file  Social History Narrative   Not on file   Social Determinants of Health   Financial Resource Strain: Not on file  Food Insecurity: Not on file  Transportation Needs: Not on file  Physical Activity: Not on file  Stress: Not on file  Social Connections: Not on file  Intimate Partner Violence: Not on file     No Known Allergies   Outpatient Medications Prior to Visit  Medication Sig Dispense Refill   albuterol (VENTOLIN HFA) 108 (90 Base) MCG/ACT inhaler Inhale 1-2 puffs into the lungs every 6 (six) hours as needed for wheezing or  shortness of breath. 1 each 2   budesonide (PULMICORT) 0.25 MG/2ML nebulizer solution Take 2 mLs (0.25 mg total) by nebulization 2 (two) times daily. 60 mL 5   diphenhydrAMINE (BENADRYL ALLERGY) 25 MG tablet Take 25 mg by mouth every 6 (six) hours as needed.     Fexofenadine HCl (ALLEGRA ALLERGY PO) Take by mouth.     guaiFENesin-dextromethorphan (ROBITUSSIN DM) 100-10 MG/5ML syrup Take 10 mLs by mouth every 8 (eight) hours. 118 mL 0   nicotine (NICODERM CQ - DOSED IN MG/24 HOURS) 21 mg/24hr patch Place 1 patch (21 mg total) onto the skin daily. 28 patch 3   polyethylene glycol (MIRALAX / GLYCOLAX) 17 g packet Take 17 g by mouth daily as needed for mild constipation. 14 each 0   Pseudoephedrine-guaiFENesin (MUCINEX D PO) Take by mouth.     No facility-administered medications prior to visit.       Objective:   Physical Exam:  General appearance: 61 y.o., female, NAD, conversant  Eyes: anicteric sclerae; PERRL, tracking appropriately HENT: NCAT; MMM Neck: Trachea midline; no lymphadenopathy, no JVD Lungs: CTAB, no crackles, no wheeze, with normal respiratory effort CV: RRR, no murmur  Abdomen: Soft, non-tender; non-distended, BS present  Extremities: No peripheral  edema, warm Skin: Normal turgor and texture; no rash Psych: Appropriate affect Neuro: Alert and oriented to person and place, no focal deficit     There were no vitals filed for this visit.   on *** LPM *** RA BMI Readings from Last 3 Encounters:  08/22/21 51.82 kg/m  08/12/21 50.24 kg/m  04/16/17 39.33 kg/m   Wt Readings from Last 3 Encounters:  08/22/21 (!) 311 lb 6.4 oz (141.3 kg)  08/12/21 (!) 301 lb 14.4 oz (136.9 kg)  04/16/17 282 lb (127.9 kg)     CBC    Component Value Date/Time   WBC 5.3 08/22/2021 0937   WBC 5.2 08/13/2021 0358   RBC 4.67 08/22/2021 0937   RBC 4.49 08/13/2021 0358   HGB 14.7 08/22/2021 0937   HCT 45.3 08/22/2021 0937   PLT 177 08/22/2021 0937   MCV 97 08/22/2021 0937    MCH 31.5 08/22/2021 0937   MCH 31.8 08/13/2021 0358   MCHC 32.5 08/22/2021 0937   MCHC 31.5 08/13/2021 0358   RDW 12.1 08/22/2021 0937   LYMPHSABS 0.8 08/12/2021 1249   MONOABS 0.9 08/12/2021 1249   EOSABS 0.0 08/12/2021 1249   BASOSABS 0.0 08/12/2021 1249    Eos 0  Chest Imaging: CTA Chest 08/12/21 reviewed by me with borderline adenopathy, emphysema, and bronchial wall thickening  Pulmonary Functions Testing Results:     No data to display          FeNO: ***  Pathology: ***  Echocardiogram: ***  Heart Catheterization: ***    Assessment & Plan:    Plan:      Lindsay Hurter, MD [facility] 08/30/2021 5:55 PM

## 2021-08-31 ENCOUNTER — Encounter: Payer: Self-pay | Admitting: Student

## 2021-08-31 ENCOUNTER — Ambulatory Visit (INDEPENDENT_AMBULATORY_CARE_PROVIDER_SITE_OTHER): Payer: BC Managed Care – PPO | Admitting: Student

## 2021-08-31 VITALS — BP 118/80 | HR 64 | Temp 97.5°F | Ht 68.0 in | Wt 308.6 lb

## 2021-08-31 DIAGNOSIS — J449 Chronic obstructive pulmonary disease, unspecified: Secondary | ICD-10-CM | POA: Diagnosis not present

## 2021-08-31 DIAGNOSIS — R0609 Other forms of dyspnea: Secondary | ICD-10-CM | POA: Diagnosis not present

## 2021-08-31 DIAGNOSIS — J9611 Chronic respiratory failure with hypoxia: Secondary | ICD-10-CM | POA: Diagnosis not present

## 2021-08-31 DIAGNOSIS — R0683 Snoring: Secondary | ICD-10-CM | POA: Diagnosis not present

## 2021-08-31 MED ORDER — BREZTRI AEROSPHERE 160-9-4.8 MCG/ACT IN AERO: 2.0000 | INHALATION_SPRAY | Freq: Two times a day (BID) | RESPIRATORY_TRACT | 11 refills | Status: DC

## 2021-08-31 NOTE — Patient Instructions (Signed)
-   START taking breztri 2 puffs twice daily with spacer, rinse mouth and spacer after use - albuterol 1-2 puffs as needed - this is your rescue inhaler - STOP using pulmicort nebulizer - you will be called to schedule sleep study, PFTs (breathing tests) - see you in 6 weeks!

## 2021-09-01 ENCOUNTER — Ambulatory Visit (INDEPENDENT_AMBULATORY_CARE_PROVIDER_SITE_OTHER): Payer: BC Managed Care – PPO | Admitting: Student

## 2021-09-01 DIAGNOSIS — R0609 Other forms of dyspnea: Secondary | ICD-10-CM

## 2021-09-01 LAB — PULMONARY FUNCTION TEST
DL/VA % pred: 58 %
DL/VA: 2.37 ml/min/mmHg/L
DLCO cor % pred: 35 %
DLCO cor: 8.16 ml/min/mmHg
DLCO unc % pred: 37 %
DLCO unc: 8.47 ml/min/mmHg
FEF 25-75 Post: 1.14 L/sec
FEF 25-75 Pre: 0.4 L/sec
FEF2575-%Change-Post: 187 %
FEF2575-%Pred-Post: 46 %
FEF2575-%Pred-Pre: 16 %
FEV1-%Change-Post: 48 %
FEV1-%Pred-Post: 41 %
FEV1-%Pred-Pre: 28 %
FEV1-Post: 1.2 L
FEV1-Pre: 0.81 L
FEV1FVC-%Change-Post: 10 %
FEV1FVC-%Pred-Pre: 71 %
FEV6-%Change-Post: 36 %
FEV6-%Pred-Post: 54 %
FEV6-%Pred-Pre: 39 %
FEV6-Post: 1.95 L
FEV6-Pre: 1.43 L
FEV6FVC-%Change-Post: 1 %
FEV6FVC-%Pred-Post: 103 %
FEV6FVC-%Pred-Pre: 102 %
FVC-%Change-Post: 34 %
FVC-%Pred-Post: 52 %
FVC-%Pred-Pre: 39 %
FVC-Post: 1.95 L
FVC-Pre: 1.46 L
Post FEV1/FVC ratio: 62 %
Post FEV6/FVC ratio: 100 %
Pre FEV1/FVC ratio: 56 %
Pre FEV6/FVC Ratio: 98 %
RV % pred: 364 %
RV: 8.19 L
TLC % pred: 174 %
TLC: 9.84 L

## 2021-09-01 NOTE — Progress Notes (Signed)
PFT done today. 

## 2021-09-12 ENCOUNTER — Telehealth: Payer: Self-pay | Admitting: Student

## 2021-09-12 NOTE — Telephone Encounter (Signed)
Sent a message to [facility] with [employer] trying to determine if patient does have appt with them tomorrow. Asked Melssia for time and address so I can give to patient. I also asked Melissa if someone from [employer] could call to relay this information to the patient.  Nothing further needed at this time

## 2021-09-13 ENCOUNTER — Telehealth: Payer: Self-pay | Admitting: Student

## 2021-09-13 NOTE — Telephone Encounter (Signed)
Fax received from Dr. Durene Cal DDS with [facility] to perform a EXTRACT 17 TEETH with LOCAL ANESTHESIA on patient.  Patient needs surgery clearance. Surgery is TBD. Patient was seen on 08/31/2021. Office protocol is a risk assessment can be sent to surgeon if patient has been seen in 60 days or less.   Sending to Dr Verlee Monte for risk assessment or recommendations if patient needs to be seen in office prior to surgical procedure.

## 2021-09-14 ENCOUNTER — Encounter: Payer: Self-pay | Admitting: Family Medicine

## 2021-09-14 ENCOUNTER — Ambulatory Visit: Payer: BC Managed Care – PPO | Admitting: Family Medicine

## 2021-09-14 VITALS — BP 129/79 | HR 66 | Temp 97.9°F | Wt 327.0 lb

## 2021-09-14 DIAGNOSIS — N898 Other specified noninflammatory disorders of vagina: Secondary | ICD-10-CM

## 2021-09-14 DIAGNOSIS — R600 Localized edema: Secondary | ICD-10-CM | POA: Diagnosis not present

## 2021-09-14 DIAGNOSIS — R0600 Dyspnea, unspecified: Secondary | ICD-10-CM | POA: Diagnosis not present

## 2021-09-14 MED ORDER — FUROSEMIDE 20 MG PO TABS: 20.0000 mg | ORAL_TABLET | Freq: Every day | ORAL | 3 refills | Status: DC

## 2021-09-14 MED ORDER — POTASSIUM CHLORIDE CRYS ER 10 MEQ PO TBCR: 10.0000 meq | EXTENDED_RELEASE_TABLET | Freq: Every day | ORAL | 3 refills | Status: DC

## 2021-09-14 MED ORDER — FLUTICASONE PROPIONATE 50 MCG/ACT NA SUSP: 2.0000 | Freq: Every day | NASAL | 6 refills | Status: AC

## 2021-09-14 MED ORDER — FLUCONAZOLE 150 MG PO TABS
150.0000 mg | ORAL_TABLET | Freq: Once | ORAL | 0 refills | Status: AC
Start: 2021-09-14 — End: 2021-09-14

## 2021-09-14 NOTE — Patient Instructions (Signed)
Medication as prescribed.  We will call regarding the echo.  If you have not heard back in the next 2 weeks, please let us know.  Take care  Dr. Lacinda Axon

## 2021-09-15 ENCOUNTER — Other Ambulatory Visit: Payer: Self-pay | Admitting: Family Medicine

## 2021-09-15 DIAGNOSIS — R0600 Dyspnea, unspecified: Secondary | ICD-10-CM

## 2021-09-15 DIAGNOSIS — R600 Localized edema: Secondary | ICD-10-CM | POA: Insufficient documentation

## 2021-09-15 DIAGNOSIS — N898 Other specified noninflammatory disorders of vagina: Secondary | ICD-10-CM | POA: Insufficient documentation

## 2021-09-15 NOTE — Assessment & Plan Note (Signed)
I suspect that most of this is due to patient's morbid obesity and decreased mobility.  However, there is concern for possible congestive heart failure given the fact that she endorses orthopnea.  Arrange for echocardiogram.  Lasix as prescribed.

## 2021-09-15 NOTE — Assessment & Plan Note (Signed)
Patient given antibiotics recently.  Empiric Diflucan.

## 2021-09-15 NOTE — Progress Notes (Signed)
Subjective:  Patient ID: Lindsay Nelson, female    DOB: 11/20/1960  Age: 61 y.o. MRN: [id number]  CC: Chief Complaint  Patient presents with   Leg Swelling    Began a few days ago. Pt states edema "all over". Pt not currently on any kind of fluid pills. Pt would like script for yeast infection-dentist placed pt on antibiotic     HPI:  61 year old female with COPD and chronic respiratory failure, morbid obesity, history tobacco abuse, hypertension presents for evaluation of the above.  Patient reports that she is experiencing edema.  Started a few days ago.  Mainly in the lower extremities.  Patient does endorse difficulty lying flat.  She uses approximately 4 pillows to sleep.  She is currently on oxygen due to respiratory failure from COPD.  No reports of chest pain.  Patient reports that she was recently placed on antibiotics from dentist.  She is experiencing vaginal itching and is concerned that she has yeast vaginitis.  Patient Active Problem List   Diagnosis Date Noted   Lower extremity edema 09/15/2021   Vaginal itching 09/15/2021   Chronic respiratory failure ([place]) 08/22/2021   History of tobacco abuse 08/22/2021   COPD (chronic obstructive pulmonary disease) ([place]) 08/22/2021   Morbid obesity ([place]) 08/22/2021   Hypertension 04/16/2017    Social Hx   Social History   Socioeconomic History   Marital status: Married    Spouse name: Not on file   Number of children: Not on file   Years of education: Not on file   Highest education level: Not on file  Occupational History   Not on file  Tobacco Use   Smoking status: Former    Packs/day: 1.00    Years: 48.00    Total pack years: 48.00    Types: Cigarettes    Quit date: 08/12/2021    Years since quitting: 0.0   Smokeless tobacco: Current  Vaping Use   Vaping Use: Never used  Substance and Sexual Activity   Alcohol use: Yes    Comment: occasionally    Drug use: Yes    Types: Marijuana    Comment: last use  04/14/2017   Sexual activity: Yes  Other Topics Concern   Not on file  Social History Narrative   Not on file   Social Determinants of Health   Financial Resource Strain: Not on file  Food Insecurity: Not on file  Transportation Needs: Not on file  Physical Activity: Not on file  Stress: Not on file  Social Connections: Not on file    Review of Systems Per HPI  Objective:  BP 129/79   Pulse 66   Temp 97.9 F (36.6 C)   Wt (!) 327 lb (148.3 kg)   SpO2 99% Comment: 2L oxygen via nasal cannula  BMI 49.72 kg/m      09/14/2021    2:51 PM 08/31/2021    3:30 PM 08/22/2021    8:45 AM  BP/Weight  Systolic BP 725 366 440  Diastolic BP 79 80 70  Wt. (Lbs) 327 308.6 311.4  BMI 49.72 kg/m2 46.92 kg/m2 51.82 kg/m2    Physical Exam Constitutional:      General: She is not in acute distress.    Appearance: She is obese.  HENT:     Head: Normocephalic and atraumatic.  Eyes:     General:        Right eye: No discharge.        Left eye:  No discharge.     Conjunctiva/sclera: Conjunctivae normal.  Cardiovascular:     Rate and Rhythm: Normal rate and regular rhythm.  Pulmonary:     Effort: Pulmonary effort is normal.     Breath sounds: No wheezing or rales.  Neurological:     Mental Status: She is alert.     Lab Results  Component Value Date   WBC 5.3 08/22/2021   HGB 14.7 08/22/2021   HCT 45.3 08/22/2021   PLT 177 08/22/2021   GLUCOSE 97 08/22/2021   CHOL 181 08/22/2021   TRIG 112 08/22/2021   HDL 59 08/22/2021   LDLCALC 102 (H) 08/22/2021   ALT 14 08/22/2021   AST 10 08/22/2021   NA 147 (H) 08/22/2021   K 5.1 08/22/2021   CL 103 08/22/2021   CREATININE 0.89 08/22/2021   BUN 14 08/22/2021   CO2 32 (H) 08/22/2021   HGBA1C 6.2 (H) 08/22/2021     Assessment & Plan:   Problem List Items Addressed This Visit       Genitourinary   Vaginal itching    Patient given antibiotics recently.  Empiric Diflucan.        Other   Lower extremity edema - Primary     I suspect that most of this is due to patient's morbid obesity and decreased mobility.  However, there is concern for possible congestive heart failure given the fact that she endorses orthopnea.  Arrange for echocardiogram.  Lasix as prescribed.      Other Visit Diagnoses     Dyspnea, unspecified type       Relevant Orders   ECHOCARDIOGRAM COMPLETE       Meds ordered this encounter  Medications   fluconazole (DIFLUCAN) 150 MG tablet    Sig: Take 1 tablet (150 mg total) by mouth once for 1 dose. Repeat dose in 72 hours.    Dispense:  2 tablet    Refill:  0   fluticasone (FLONASE) 50 MCG/ACT nasal spray    Sig: Place 2 sprays into both nostrils daily.    Dispense:  16 g    Refill:  6   furosemide (LASIX) 20 MG tablet    Sig: Take 1 tablet (20 mg total) by mouth daily.    Dispense:  30 tablet    Refill:  3   potassium chloride (KLOR-CON M) 10 MEQ tablet    Sig: Take 1 tablet (10 mEq total) by mouth daily.    Dispense:  30 tablet    Refill:  3    Follow-up:  Pending Echo  [place]

## 2021-09-23 NOTE — Telephone Encounter (Signed)
For Ms. Kopec, risk of perioperative pulmonary complications is increased by:  '[ ]'$ Age greater than 71 years  '[X]'$  COPD  '[ ]'$  Serum albumin <3.5  '[ ]'$  Smoking  '[ ]'$  Obstructive sleep apnea  '[ ]'$  NYHA Class II Pulmonary Hypertension  Respiratory complications generally occur in 1% of ASA Class I patients, 5% of ASA Class II and 10% of ASA Class III-IV patients These complications rarely result in mortality and include postoperative pneumonia, atelectasis, pulmonary embolism, ARDS and increased time requiring postoperative mechanical ventilation.  Overall, I recommend moving forward with the procedure if the risk for respiratory complications are outweighed by the potential benefits. This will need to be discussed between the patient and proceduralist.  To reduce risks of respiratory complications, I recommend: --Encourage mobility and incentive spirometry post-op  For any questions or concerns, please contact our office.

## 2021-09-23 NOTE — Telephone Encounter (Signed)
OV notes and clearance form have been faxed back to [facility]. Nothing further needed at this time.  

## 2021-09-27 LAB — COLOGUARD: COLOGUARD: NEGATIVE

## 2021-10-20 ENCOUNTER — Ambulatory Visit: Payer: BC Managed Care – PPO | Admitting: Family Medicine

## 2021-10-20 VITALS — BP 109/78 | HR 69 | Temp 98.1°F | Ht 68.0 in | Wt 329.0 lb

## 2021-10-20 DIAGNOSIS — R600 Localized edema: Secondary | ICD-10-CM | POA: Diagnosis not present

## 2021-10-20 NOTE — Patient Instructions (Signed)
Lab today.  We are try to arrange the Echo to look at your heart.  After your labs return, I will increase the lasix if I can.  Follow up in 3 months.

## 2021-10-21 DIAGNOSIS — E785 Hyperlipidemia, unspecified: Secondary | ICD-10-CM | POA: Insufficient documentation

## 2021-10-21 DIAGNOSIS — R7303 Prediabetes: Secondary | ICD-10-CM | POA: Insufficient documentation

## 2021-10-21 LAB — BASIC METABOLIC PANEL
BUN/Creatinine Ratio: 12 (ref 12–28)
BUN: 13 mg/dL (ref 8–27)
CO2: 26 mmol/L (ref 20–29)
Calcium: 8.7 mg/dL (ref 8.7–10.3)
Chloride: 104 mmol/L (ref 96–106)
Creatinine, Ser: 1.05 mg/dL — ABNORMAL HIGH (ref 0.57–1.00)
Glucose: 91 mg/dL (ref 70–99)
Potassium: 4.7 mmol/L (ref 3.5–5.2)
Sodium: 144 mmol/L (ref 134–144)
eGFR: 60 mL/min/{1.73_m2} (ref >59)

## 2021-10-21 NOTE — Progress Notes (Signed)
Subjective:  Patient ID: Lindsay Nelson, female    DOB: [birth year]  Age: 61 y.o. MRN: [id number]  CC: Chief Complaint  Patient presents with   Leg Swelling    Follow up     HPI:  61 year old female with COPD, chronic respiratory failure on nasal cannula O2, morbid obesity presents for follow-up.  Patient reports that she has not had significant improvement in her lower extremity edema despite use of Lasix.  She states that she is using the restroom often but has not noticed a significant improvement in edema.  She states that she believes that this is likely secondary to the fact that she is constantly sitting.  She even sleeps in a seated position.  When she sleeps in the bed, she is unable to elevate her legs.  Mobility has been limited due to respiratory failure and COPD.  Needs metabolic panel today.   Patient Active Problem List   Diagnosis Date Noted   Prediabetes 10/21/2021   Hyperlipidemia 10/21/2021   Lower extremity edema 09/15/2021   Chronic respiratory failure ([place]) 08/22/2021   History of tobacco abuse 08/22/2021   COPD (chronic obstructive pulmonary disease) ([place]) 08/22/2021   Morbid obesity ([place]) 08/22/2021   Hypertension 04/16/2017    Social Hx   Social History   Socioeconomic History   Marital status: Married    Spouse name: Not on file   Number of children: Not on file   Years of education: Not on file   Highest education level: Not on file  Occupational History   Not on file  Tobacco Use   Smoking status: Former    Packs/day: 1.00    Years: 48.00    Total pack years: 48.00    Types: Cigarettes    Quit date: 08/12/2021    Years since quitting: 0.1   Smokeless tobacco: Current  Vaping Use   Vaping Use: Never used  Substance and Sexual Activity   Alcohol use: Yes    Comment: occasionally    Drug use: Yes    Types: Marijuana    Comment: last use 04/14/2017   Sexual activity: Yes  Other Topics Concern   Not on file  Social History Narrative    Not on file   Social Determinants of Health   Financial Resource Strain: Not on file  Food Insecurity: Not on file  Transportation Needs: Not on file  Physical Activity: Not on file  Stress: Not on file  Social Connections: Not on file    Review of Systems Per HPI  Objective:  BP 109/78   Pulse 69   Temp 98.1 F (36.7 C)   Ht '5\' 8"'$  (1.727 m)   Wt (!) 329 lb (149.2 kg)   SpO2 98%   BMI 50.02 kg/m      10/20/2021   10:08 AM 09/14/2021    2:51 PM 08/31/2021    3:30 PM  BP/Weight  Systolic BP 793 903 009  Diastolic BP 78 79 80  Wt. (Lbs) 329 327 308.6  BMI 50.02 kg/m2 49.72 kg/m2 46.92 kg/m2    Physical Exam Vitals and nursing note reviewed.  Constitutional:      General: She is not in acute distress.    Appearance: She is obese.  HENT:     Head: Normocephalic and atraumatic.  Cardiovascular:     Rate and Rhythm: Normal rate and regular rhythm.     Comments: 1+ pitting lower extremity edema. Pulmonary:     Effort: Pulmonary effort is normal.  Breath sounds: No wheezing.  Neurological:     Mental Status: She is alert.  Psychiatric:        Mood and Affect: Mood normal.        Behavior: Behavior normal.     Lab Results  Component Value Date   WBC 5.3 08/22/2021   HGB 14.7 08/22/2021   HCT 45.3 08/22/2021   PLT 177 08/22/2021   GLUCOSE 91 10/20/2021   CHOL 181 08/22/2021   TRIG 112 08/22/2021   HDL 59 08/22/2021   LDLCALC 102 (H) 08/22/2021   ALT 14 08/22/2021   AST 10 08/22/2021   NA 144 10/20/2021   K 4.7 10/20/2021   CL 104 10/20/2021   CREATININE 1.05 (H) 10/20/2021   BUN 13 10/20/2021   CO2 26 10/20/2021   HGBA1C 6.2 (H) 08/22/2021     Assessment & Plan:   Problem List Items Addressed This Visit       Other   Lower extremity edema - Primary    Metabolic panel today to assess renal function.  If renal function allows will increase Lasix.  Awaiting patient's echocardiogram.      Relevant Orders   Basic Metabolic Panel  (Completed)    Follow-up:  Return in about 3 months (around 01/19/2022).  [place]

## 2021-10-21 NOTE — Assessment & Plan Note (Signed)
Metabolic panel today to assess renal function.  If renal function allows will increase Lasix.  Awaiting patient's echocardiogram.

## 2021-10-31 ENCOUNTER — Ambulatory Visit (HOSPITAL_COMMUNITY)
Admission: RE | Admit: 2021-10-31 | Discharge: 2021-10-31 | Disposition: A | Discharge status: Home / Self Care | Payer: BC Managed Care – PPO | Source: Ambulatory Visit | Attending: Family Medicine | Admitting: Family Medicine

## 2021-10-31 ENCOUNTER — Telehealth: Payer: Self-pay | Admitting: Student

## 2021-10-31 ENCOUNTER — Other Ambulatory Visit (HOSPITAL_COMMUNITY): Payer: BC Managed Care – PPO

## 2021-10-31 DIAGNOSIS — R0600 Dyspnea, unspecified: Secondary | ICD-10-CM | POA: Insufficient documentation

## 2021-10-31 LAB — ECHOCARDIOGRAM COMPLETE
Area-P 1/2: 3.21 cm2
S' Lateral: 3.5 cm

## 2021-10-31 NOTE — Telephone Encounter (Signed)
Called and spoke with patient's husband, Lindsay Nelson regarding POC.  I advised him that they will have to contact [employer] to schedule a time to go pick out a POC.  He verbalized understanding.  I provided him with [employer]'s phone number.  Nothing further needed.  Called and spoke with Brad ([employer]) regarding POC.  He contacted [name] ([employer]).  She needs to call them to schedule a time to come in to pick out her POC, they need to know what they are able to carry as far as weight, ect.  Advised I would call her and make her aware.

## 2021-10-31 NOTE — Telephone Encounter (Signed)
Patient's husband states patient is trying to get a portable oxygen machine but has not heard anything. I did give patient [employer]'s number, but they would like to talk to someone at our office first.  Please advise- call [phone]

## 2021-10-31 NOTE — Progress Notes (Signed)
*  PRELIMINARY RESULTS* Echocardiogram 2D Echocardiogram has been performed. Patient di not want Definity at this time.  Lindsay Nelson 10/31/2021, 9:35 AM

## 2021-11-07 ENCOUNTER — Other Ambulatory Visit (HOSPITAL_COMMUNITY): Payer: BC Managed Care – PPO

## 2021-11-08 ENCOUNTER — Encounter: Payer: BC Managed Care – PPO | Admitting: Pulmonary Disease

## 2021-11-12 ENCOUNTER — Other Ambulatory Visit: Payer: Self-pay | Admitting: Family Medicine

## 2021-11-16 ENCOUNTER — Telehealth: Payer: Self-pay

## 2021-11-16 MED ORDER — POTASSIUM CHLORIDE CRYS ER 10 MEQ PO TBCR: 10.0000 meq | EXTENDED_RELEASE_TABLET | Freq: Every day | ORAL | 3 refills | Status: DC

## 2021-11-16 NOTE — Telephone Encounter (Signed)
Encourage patient to contact the pharmacy for refills or they can request refills through [facility]  (Please schedule appointment if patient has not been seen in over a year)    WHAT PHARMACY WOULD THEY LIKE THIS SENT TO: [facility]   MEDICATION NAME & DOSE:potassium chloride (KLOR-CON M) 10 MEQ tablet , breztri inhaler ? I do not see ,   NOTES/COMMENTS FROM PATIENT:      [place] office please notify patient: It takes 48-72 hours to process rx refill requests Ask patient to call pharmacy to ensure rx is ready before heading there.

## 2021-11-22 ENCOUNTER — Ambulatory Visit: Payer: BC Managed Care – PPO | Admitting: Family Medicine

## 2022-01-19 ENCOUNTER — Ambulatory Visit: Payer: BC Managed Care – PPO | Admitting: Family Medicine

## 2022-01-19 DIAGNOSIS — Z23 Encounter for immunization: Secondary | ICD-10-CM

## 2022-01-19 DIAGNOSIS — I10 Essential (primary) hypertension: Secondary | ICD-10-CM | POA: Diagnosis not present

## 2022-01-19 DIAGNOSIS — J439 Emphysema, unspecified: Secondary | ICD-10-CM

## 2022-01-19 MED ORDER — PROMETHAZINE-DM 6.25-15 MG/5ML PO SYRP: 5.0000 mL | ORAL_SOLUTION | Freq: Four times a day (QID) | ORAL | 0 refills | Status: DC | PRN

## 2022-01-19 MED ORDER — SEMAGLUTIDE-WEIGHT MANAGEMENT 0.25 MG/0.5ML ~~LOC~~ SOAJ: 0.2500 mg | SUBCUTANEOUS | 0 refills | Status: DC

## 2022-01-19 NOTE — Assessment & Plan Note (Signed)
BP well-controlled.  Continue Lasix.

## 2022-01-19 NOTE — Assessment & Plan Note (Signed)
No evidence of exacerbation currently.  Promethazine DM for cough.

## 2022-01-19 NOTE — Assessment & Plan Note (Addendum)
Rx sent for [facility].  Discussed nutrition referral as well as seeing bariatric surgery.

## 2022-01-19 NOTE — Progress Notes (Signed)
Subjective:  Patient ID: Lindsay Nelson, female    DOB: 1960/06/09  Age: 61 y.o. MRN: [id number]  CC: Chief Complaint  Patient presents with   Follow-up    Patient wants to discuss about weight management     HPI:  61 year old female with morbid obesity, hypertension, COPD with chronic respiratory failure (on home O2), hyperlipidemia, prediabetes presents for follow-up.  Patient states that her respiratory status is the same.  No increasing shortness of breath.  She does report that she is having significant cough particularly at night.  She is using over-the-counter medication without resolution.  Patient is interested in pharmacotherapy regarding weight loss.  She would like to discuss this today.  Hypertension is stable.  She is on Lasix and potassium.  Advised patient that she should consider getting her influenza vaccine as well as RSV vaccine and pneumococcal vaccine.  Patient is amenable to getting flu shot today.  Patient Active Problem List   Diagnosis Date Noted   Prediabetes 10/21/2021   Hyperlipidemia 10/21/2021   Lower extremity edema 09/15/2021   Chronic respiratory failure ([place]) 08/22/2021   History of tobacco abuse 08/22/2021   COPD (chronic obstructive pulmonary disease) ([place]) 08/22/2021   Morbid obesity ([place]) 08/22/2021   Hypertension 04/16/2017    Social Hx   Social History   Socioeconomic History   Marital status: Married    Spouse name: Not on file   Number of children: Not on file   Years of education: Not on file   Highest education level: Not on file  Occupational History   Not on file  Tobacco Use   Smoking status: Former    Packs/day: 1.00    Years: 48.00    Total pack years: 48.00    Types: Cigarettes    Quit date: 08/12/2021    Years since quitting: 0.4   Smokeless tobacco: Current  Vaping Use   Vaping Use: Never used  Substance and Sexual Activity   Alcohol use: Yes    Comment: occasionally    Drug use: Yes    Types:  Marijuana    Comment: last use 04/14/2017   Sexual activity: Yes  Other Topics Concern   Not on file  Social History Narrative   Not on file   Social Determinants of Health   Financial Resource Strain: Not on file  Food Insecurity: Not on file  Transportation Needs: Not on file  Physical Activity: Not on file  Stress: Not on file  Social Connections: Not on file    Review of Systems Per HPI  Objective:  BP 130/84   Pulse 88   Temp 98.3 F (36.8 C) (Oral)   Wt (!) 330 lb 3.2 oz (149.8 kg)   SpO2 94%   BMI 50.21 kg/m      01/19/2022   10:35 AM 10/20/2021   10:08 AM 09/14/2021    2:51 PM  BP/Weight  Systolic BP 258 527 782  Diastolic BP 84 78 79  Wt. (Lbs) 330.2 329 327  BMI 50.21 kg/m2 50.02 kg/m2 49.72 kg/m2    Physical Exam Vitals and nursing note reviewed.  Constitutional:      General: She is not in acute distress.    Appearance: She is obese.  HENT:     Head: Normocephalic and atraumatic.  Eyes:     General:        Right eye: No discharge.        Left eye: No discharge.     Conjunctiva/sclera: Conjunctivae  normal.  Cardiovascular:     Rate and Rhythm: Normal rate and regular rhythm.  Pulmonary:     Effort: Pulmonary effort is normal.     Breath sounds: No wheezing.  Neurological:     Mental Status: She is alert.     Lab Results  Component Value Date   WBC 5.3 08/22/2021   HGB 14.7 08/22/2021   HCT 45.3 08/22/2021   PLT 177 08/22/2021   GLUCOSE 91 10/20/2021   CHOL 181 08/22/2021   TRIG 112 08/22/2021   HDL 59 08/22/2021   LDLCALC 102 (H) 08/22/2021   ALT 14 08/22/2021   AST 10 08/22/2021   NA 144 10/20/2021   K 4.7 10/20/2021   CL 104 10/20/2021   CREATININE 1.05 (H) 10/20/2021   BUN 13 10/20/2021   CO2 26 10/20/2021   HGBA1C 6.2 (H) 08/22/2021     Assessment & Plan:   Problem List Items Addressed This Visit       Cardiovascular and Mediastinum   Hypertension    BP well-controlled.  Continue Lasix.        Respiratory    COPD (chronic obstructive pulmonary disease) (HCC)    No evidence of exacerbation currently.  Promethazine DM for cough.      Relevant Medications   promethazine-dextromethorphan (PROMETHAZINE-DM) 6.25-15 MG/5ML syrup     Other   Morbid obesity ([place])    Rx sent for [facility].  Discussed nutrition referral as well as seeing bariatric surgery.      Relevant Medications   Semaglutide-Weight Management 0.25 MG/0.5ML SOAJ   Other Visit Diagnoses     Needs flu shot       Relevant Orders   Flu Vaccine QUAD 24moIM (Fluarix, Fluzone & Alfiuria Quad PF) (Completed)       Meds ordered this encounter  Medications   Semaglutide-Weight Management 0.25 MG/0.5ML SOAJ    Sig: Inject 0.25 mg into the skin once a week.    Dispense:  2 mL    Refill:  0   promethazine-dextromethorphan (PROMETHAZINE-DM) 6.25-15 MG/5ML syrup    Sig: Take 5 mLs by mouth 4 (four) times daily as needed for cough.    Dispense:  118 mL    Refill:  0    Follow-up: 3 to 6 months  [place]

## 2022-01-19 NOTE — Patient Instructions (Signed)
Medication as prescribed.  Consider pneumonia vaccine and RSV vaccine.  Follow up in 3-6 months.

## 2022-03-14 ENCOUNTER — Telehealth: Payer: Self-pay

## 2022-03-14 MED ORDER — FLUCONAZOLE 150 MG PO TABS: 150.0000 mg | ORAL_TABLET | Freq: Every day | ORAL | 0 refills | Status: DC

## 2022-03-14 NOTE — Telephone Encounter (Signed)
Patient states she has yeast infection. Patient denies pelvic pain, bleeding, and fever. Diflucan sent in per office protocol.

## 2022-05-18 ENCOUNTER — Other Ambulatory Visit: Payer: Self-pay | Admitting: Family Medicine

## 2022-06-19 ENCOUNTER — Ambulatory Visit: Payer: BC Managed Care – PPO | Admitting: Family Medicine

## 2022-06-26 ENCOUNTER — Ambulatory Visit: Payer: BC Managed Care – PPO | Admitting: Family Medicine

## 2022-06-26 VITALS — BP 134/86 | Temp 97.7°F | Ht 68.0 in | Wt 329.0 lb

## 2022-06-26 DIAGNOSIS — R21 Rash and other nonspecific skin eruption: Secondary | ICD-10-CM | POA: Insufficient documentation

## 2022-06-26 DIAGNOSIS — B3731 Acute candidiasis of vulva and vagina: Secondary | ICD-10-CM | POA: Diagnosis not present

## 2022-06-26 DIAGNOSIS — I10 Essential (primary) hypertension: Secondary | ICD-10-CM

## 2022-06-26 DIAGNOSIS — J439 Emphysema, unspecified: Secondary | ICD-10-CM | POA: Diagnosis not present

## 2022-06-26 MED ORDER — ALBUTEROL SULFATE HFA 108 (90 BASE) MCG/ACT IN AERS: 1.0000 | INHALATION_SPRAY | Freq: Four times a day (QID) | RESPIRATORY_TRACT | 2 refills | Status: DC | PRN

## 2022-06-26 MED ORDER — TRELEGY ELLIPTA 100-62.5-25 MCG/ACT IN AEPB: 1.0000 | INHALATION_SPRAY | Freq: Every day | RESPIRATORY_TRACT | 11 refills | Status: DC

## 2022-06-26 MED ORDER — FLUCONAZOLE 150 MG PO TABS: 150.0000 mg | ORAL_TABLET | Freq: Every day | ORAL | 0 refills | Status: DC

## 2022-06-26 MED ORDER — CLOBETASOL PROPIONATE 0.05 % EX OINT: 1.0000 | TOPICAL_OINTMENT | Freq: Two times a day (BID) | CUTANEOUS | 0 refills | Status: DC

## 2022-06-26 NOTE — Progress Notes (Signed)
Subjective:  Patient ID: Lindsay Nelson, female    DOB: Jan 16, 1961  Age: 62 y.o. MRN: [id number]  CC: Chief Complaint  Patient presents with   Rash    On back of shoulder, feels like bites and itches x 1 month   Vaginitis    Reports itching due to recent antibiotic use   inhaler and pulmonary     Questions about breztri inhaler    HPI:  63 year old female with hypertension, chronic respiratory failure, COPD, hyperlipidemia, morbid obesity presents for evaluation of the above.  Patient reports rash to posterior aspects of the shoulders/upper back for the past month.  She states that it itches quite severely.  No relieving factors.  No known inciting factor.  Patient reports recent antibiotic use due to a dental infection.  She is now having vaginal itching and believes that she has yeast vaginitis.  She would like treatment.  Lastly, patient reports that her Markus Daft is no longer affordable.  She needs a different treatment option regarding her COPD.  Patient Active Problem List   Diagnosis Date Noted   Yeast vaginitis 06/26/2022   Rash 06/26/2022   Prediabetes 10/21/2021   Hyperlipidemia 10/21/2021   Lower extremity edema 09/15/2021   Chronic respiratory failure (HCC) 08/22/2021   History of tobacco abuse 08/22/2021   COPD (chronic obstructive pulmonary disease) (HCC) 08/22/2021   Morbid obesity (HCC) 08/22/2021   Hypertension 04/16/2017    Social Hx   Social History   Socioeconomic History   Marital status: Married    Spouse name: Not on file   Number of children: Not on file   Years of education: Not on file   Highest education level: Not on file  Occupational History   Not on file  Tobacco Use   Smoking status: Former    Packs/day: 1.00    Years: 48.00    Additional pack years: 0.00    Total pack years: 48.00    Types: Cigarettes    Quit date: 08/12/2021    Years since quitting: 0.8   Smokeless tobacco: Current  Vaping Use   Vaping Use: Never used   Substance and Sexual Activity   Alcohol use: Yes    Comment: occasionally    Drug use: Yes    Types: Marijuana    Comment: last use 04/14/2017   Sexual activity: Yes  Other Topics Concern   Not on file  Social History Narrative   Not on file   Social Determinants of Health   Financial Resource Strain: Not on file  Food Insecurity: Not on file  Transportation Needs: Not on file  Physical Activity: Not on file  Stress: Not on file  Social Connections: Not on file    Review of Systems Per HPI  Objective:  BP 134/86   Temp 97.7 F (36.5 C)   Ht 5\' 8"  (1.727 m)   Wt (!) 329 lb (149.2 kg)   SpO2 96%   BMI 50.02 kg/m      06/26/2022   11:41 AM 01/19/2022   10:35 AM 10/20/2021   10:08 AM  BP/Weight  Systolic BP 134 130 109  Diastolic BP 86 84 78  Wt. (Lbs) 329 330.2 329  BMI 50.02 kg/m2 50.21 kg/m2 50.02 kg/m2    Physical Exam Vitals and nursing note reviewed.  Constitutional:      General: She is not in acute distress.    Appearance: She is obese.  HENT:     Head: Normocephalic and atraumatic.  Eyes:  General:        Right eye: No discharge.        Left eye: No discharge.     Conjunctiva/sclera: Conjunctivae normal.  Cardiovascular:     Rate and Rhythm: Normal rate and regular rhythm.  Pulmonary:     Effort: Pulmonary effort is normal. No respiratory distress.  Skin:    Comments: Upper back above the spine of scapula with excoriated hyperpigmented rash  Neurological:     Mental Status: She is alert.     Lab Results  Component Value Date   WBC 5.3 08/22/2021   HGB 14.7 08/22/2021   HCT 45.3 08/22/2021   PLT 177 08/22/2021   GLUCOSE 91 10/20/2021   CHOL 181 08/22/2021   TRIG 112 08/22/2021   HDL 59 08/22/2021   LDLCALC 102 (H) 08/22/2021   ALT 14 08/22/2021   AST 10 08/22/2021   NA 144 10/20/2021   K 4.7 10/20/2021   CL 104 10/20/2021   CREATININE 1.05 (H) 10/20/2021   BUN 13 10/20/2021   CO2 26 10/20/2021   HGBA1C 6.2 (H) 08/22/2021      Assessment & Plan:   Problem List Items Addressed This Visit       Cardiovascular and Mediastinum   Hypertension    Stable.  Continue Lasix.        Respiratory   COPD (chronic obstructive pulmonary disease) (HCC) - Primary    Sending in Trelegy.      Relevant Medications   albuterol (VENTOLIN HFA) 108 (90 Base) MCG/ACT inhaler   Fluticasone-Umeclidin-Vilant (TRELEGY ELLIPTA) 100-62.5-25 MCG/ACT AEPB     Musculoskeletal and Integument   Rash    Treating with clobetasol.        Genitourinary   Yeast vaginitis    Treating with Diflucan.      Relevant Medications   fluconazole (DIFLUCAN) 150 MG tablet    Meds ordered this encounter  Medications   albuterol (VENTOLIN HFA) 108 (90 Base) MCG/ACT inhaler    Sig: Inhale 1-2 puffs into the lungs every 6 (six) hours as needed for wheezing or shortness of breath.    Dispense:  1 each    Refill:  2   fluconazole (DIFLUCAN) 150 MG tablet    Sig: Take 1 tablet (150 mg total) by mouth daily.    Dispense:  2 tablet    Refill:  0    Take 1 tablet by mouth now may repeat in three days   clobetasol ointment (TEMOVATE) 0.05 %    Sig: Apply 1 Application topically 2 (two) times daily.    Dispense:  30 g    Refill:  0   Fluticasone-Umeclidin-Vilant (TRELEGY ELLIPTA) 100-62.5-25 MCG/ACT AEPB    Sig: Inhale 1 puff into the lungs daily.    Dispense:  1 each    Refill:  11    Lindsay Mckesson DO [facility]

## 2022-06-26 NOTE — Assessment & Plan Note (Signed)
Stable. Continue Lasix. 

## 2022-06-26 NOTE — Patient Instructions (Signed)
Medications as prescribed. ° °Take care ° °Dr. Lakya Schrupp  °

## 2022-06-26 NOTE — Assessment & Plan Note (Signed)
Treating with clobetasol. ?

## 2022-06-26 NOTE — Assessment & Plan Note (Signed)
Treating with Diflucan. 

## 2022-06-26 NOTE — Assessment & Plan Note (Signed)
Sending in Trelegy.

## 2022-07-20 ENCOUNTER — Ambulatory Visit: Payer: BC Managed Care – PPO | Admitting: Family Medicine

## 2022-08-17 ENCOUNTER — Other Ambulatory Visit: Payer: Self-pay | Admitting: Family Medicine

## 2022-08-19 ENCOUNTER — Other Ambulatory Visit: Payer: Self-pay | Admitting: Family Medicine

## 2022-08-31 ENCOUNTER — Telehealth: Payer: Self-pay | Admitting: Student

## 2022-08-31 NOTE — Telephone Encounter (Signed)
PT needs a RX sent to his supplier for a longer green cord and a longer clear cord for home O2. If you have questions please call Mr. Salada @ [phone].

## 2022-09-12 ENCOUNTER — Other Ambulatory Visit: Payer: Self-pay | Admitting: Family Medicine

## 2022-09-12 NOTE — Telephone Encounter (Signed)
Sending to DOD   Is it okay to send order for oxygen supplies. Lindsay Nelson

## 2022-09-13 NOTE — Telephone Encounter (Signed)
Called patient's husband, he did not answer and the VM is full. Need to confirm the DME before placing order.

## 2022-09-18 NOTE — Telephone Encounter (Signed)
I tried to call today. No answer. Closing encounter.

## 2022-11-03 ENCOUNTER — Other Ambulatory Visit: Payer: Self-pay | Admitting: Family Medicine

## 2022-11-19 ENCOUNTER — Other Ambulatory Visit: Payer: Self-pay | Admitting: Family Medicine

## 2023-01-02 ENCOUNTER — Other Ambulatory Visit: Payer: Self-pay | Admitting: Family Medicine

## 2023-02-04 ENCOUNTER — Other Ambulatory Visit: Payer: Self-pay | Admitting: Family Medicine

## 2023-02-16 ENCOUNTER — Other Ambulatory Visit: Payer: Self-pay | Admitting: Family Medicine

## 2023-02-25 IMAGING — CT CT HEAD W/O CM
4 series · 17 of 47 positions shown, 19 images · non-contrast
Comparison: 04/16/2017

CLINICAL DATA: Headaches



[Series 2: head w o · axial · 0.49mm/px · z∈[+217,+337]mm · 7 of 32 slices shown, 9 images]
[im 4/32  brain]
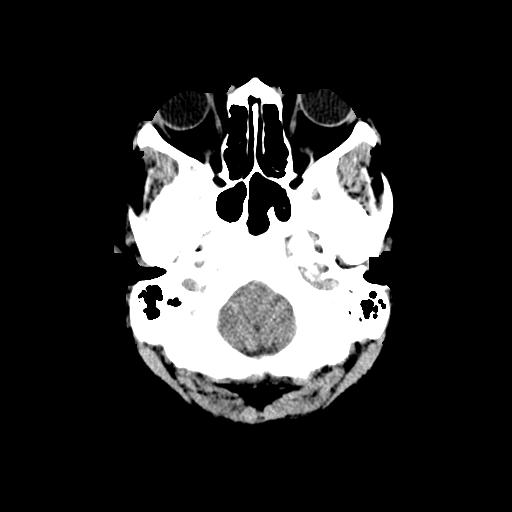
[im 4/32  bone]
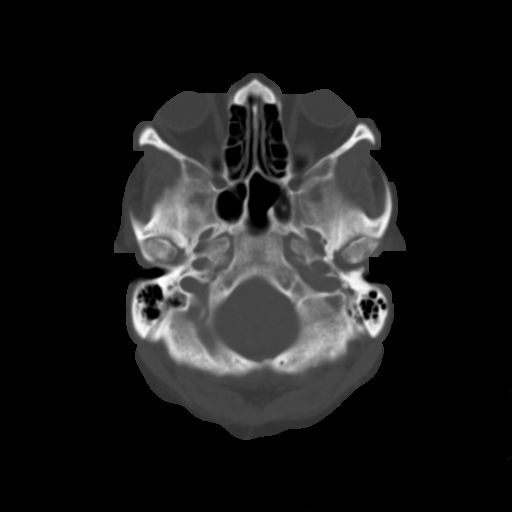
[im 8/32  brain]
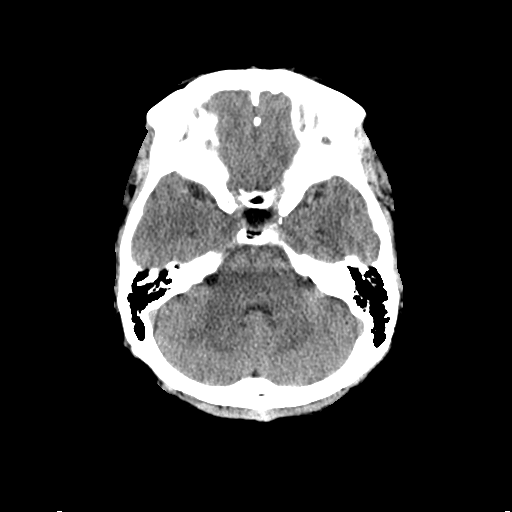
[im 12/32  brain]
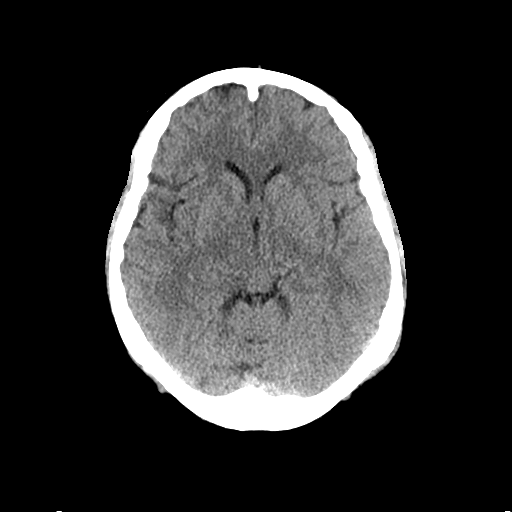
[im 16/32  brain]
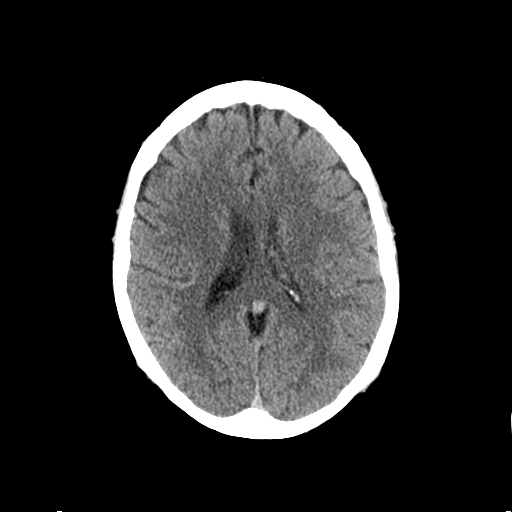
[im 20/32  brain]
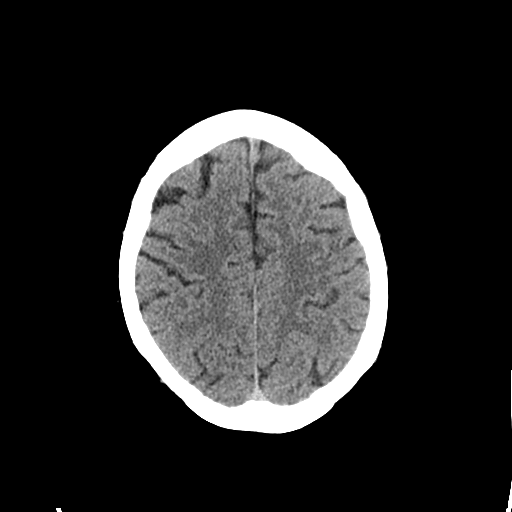
[im 20/32  bone]
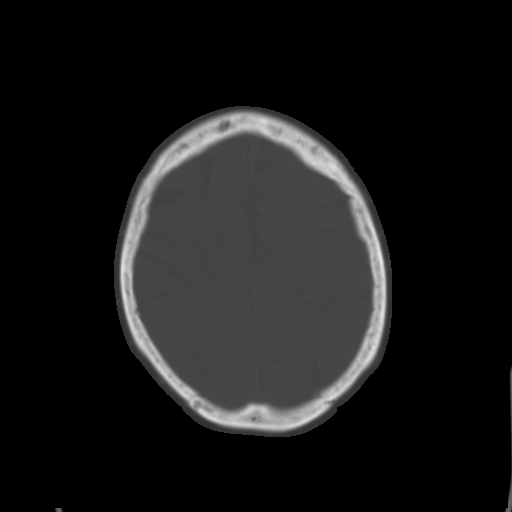
[im 24/32  brain]
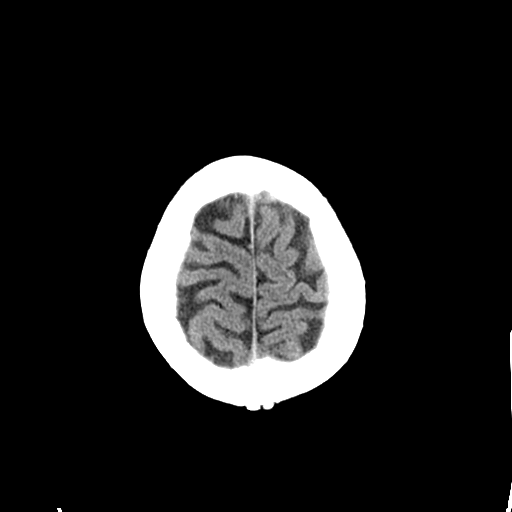
[im 28/32  brain]
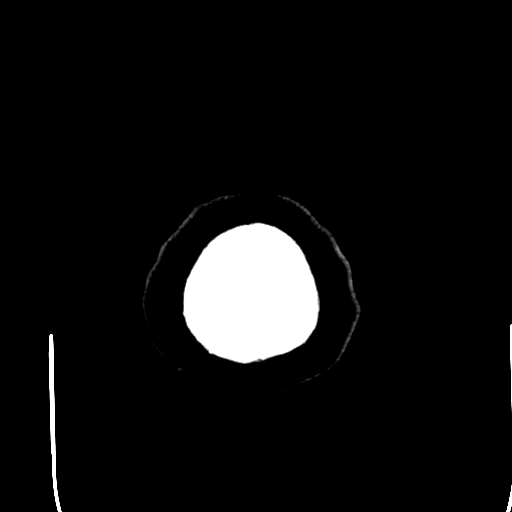

[Series 3: head bone · axial · 0.49mm/px · z∈[+216,+272]mm · 4 of 79 slices shown]
[im 8/79  bone]
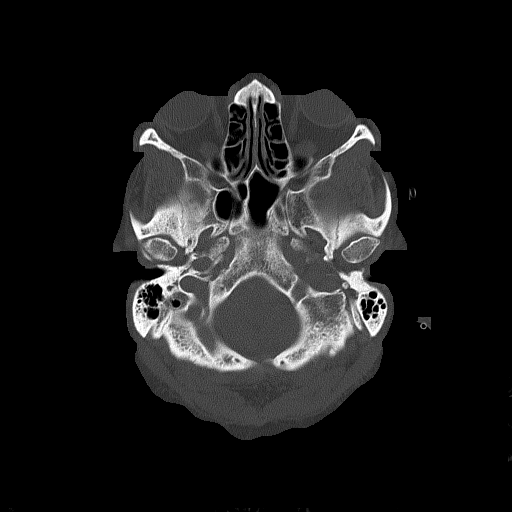
[im 16/79  bone]
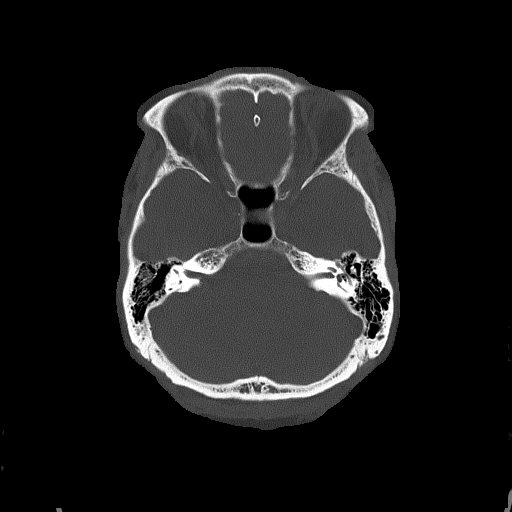
[im 24/79  bone]
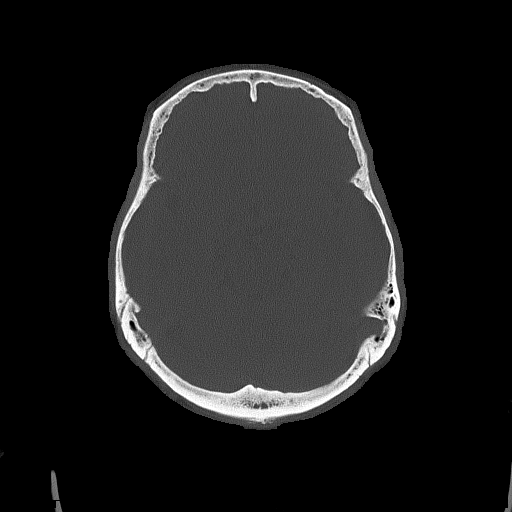
[im 36/79  bone]
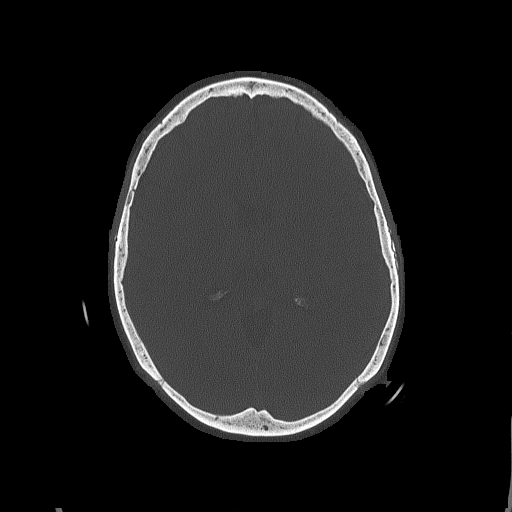

[Series 4: coronal soft · coronal · 0.34mm/px · 3 of 68 slices shown]
[im 23/68  brain]
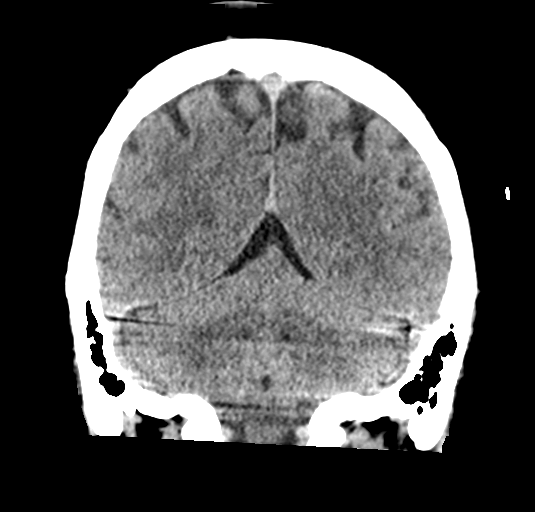
[im 30/68  brain]
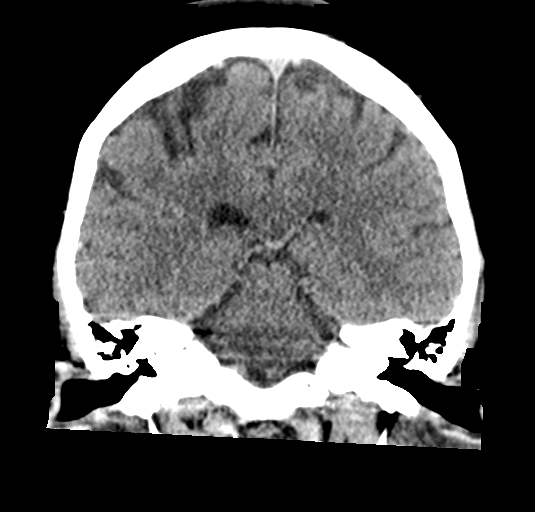
[im 38/68  brain]
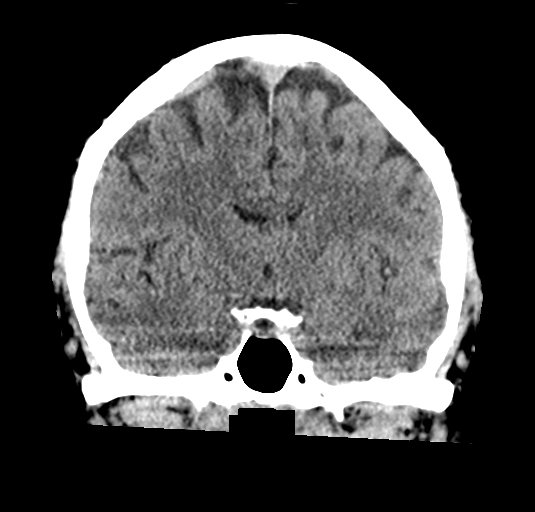

[Series 5: sagittal soft · sagittal · 0.34mm/px · 3 of 62 slices shown]
[im 21/62  brain]
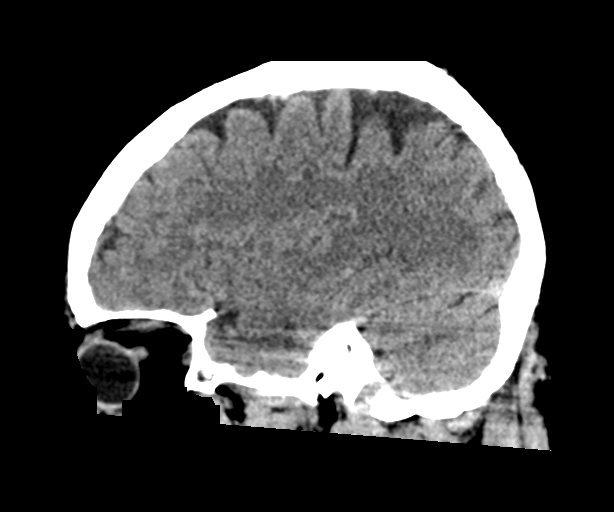
[im 31/62  brain]
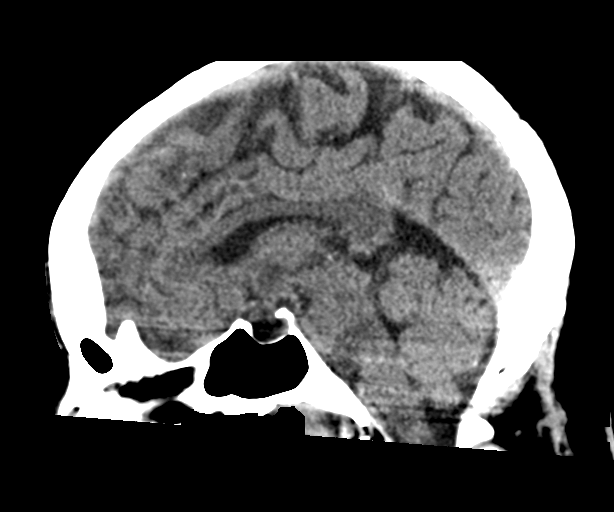
[im 41/62  brain]
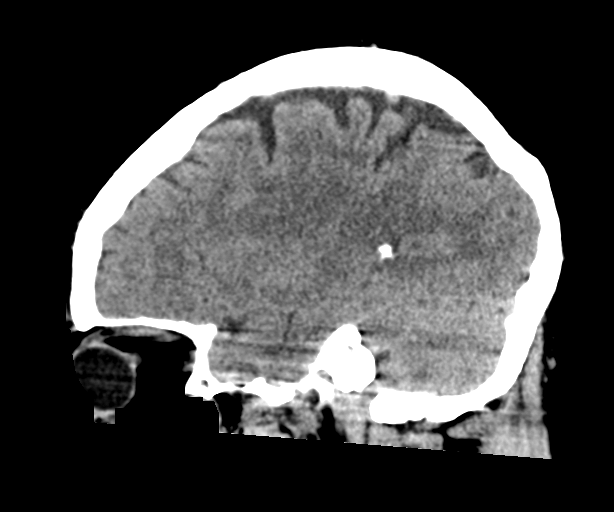

[17 of 47 positions shown; findings below may reference images not displayed]

FINDINGS: Brain: No acute intracranial findings are seen. There are no signs
of bleeding within the cranium. Cortical sulci are prominent.
Ventricles are not dilated.

Vascular: Unremarkable.

Skull: Unremarkable.

Sinuses/Orbits: There are no air-fluid levels in the sinuses.

Other: No significant interval changes are noted.
IMPRESSION: No acute intracranial findings are seen in noncontrast CT brain. No
significant interval changes are noted.

## 2023-03-13 ENCOUNTER — Other Ambulatory Visit: Payer: Self-pay | Admitting: Family Medicine

## 2023-04-06 ENCOUNTER — Other Ambulatory Visit: Payer: Self-pay | Admitting: Family Medicine

## 2023-05-15 ENCOUNTER — Other Ambulatory Visit: Payer: Self-pay | Admitting: Family Medicine

## 2023-05-21 ENCOUNTER — Encounter: Admitting: Family Medicine

## 2023-05-29 ENCOUNTER — Ambulatory Visit (INDEPENDENT_AMBULATORY_CARE_PROVIDER_SITE_OTHER): Payer: Self-pay | Admitting: Family Medicine

## 2023-05-29 ENCOUNTER — Encounter: Payer: Self-pay | Admitting: Family Medicine

## 2023-05-29 ENCOUNTER — Encounter: Payer: Self-pay | Admitting: *Deleted

## 2023-05-29 VITALS — BP 128/84 | HR 73 | Temp 98.4°F | Ht 68.0 in | Wt 331.0 lb

## 2023-05-29 DIAGNOSIS — I10 Essential (primary) hypertension: Secondary | ICD-10-CM

## 2023-05-29 DIAGNOSIS — K047 Periapical abscess without sinus: Secondary | ICD-10-CM

## 2023-05-29 DIAGNOSIS — R7303 Prediabetes: Secondary | ICD-10-CM

## 2023-05-29 DIAGNOSIS — J439 Emphysema, unspecified: Secondary | ICD-10-CM | POA: Diagnosis not present

## 2023-05-29 DIAGNOSIS — Z87891 Personal history of nicotine dependence: Secondary | ICD-10-CM

## 2023-05-29 DIAGNOSIS — Z23 Encounter for immunization: Secondary | ICD-10-CM | POA: Diagnosis not present

## 2023-05-29 DIAGNOSIS — Z130 Encounter for screening for diseases of the blood and blood-forming organs and certain disorders involving the immune mechanism: Secondary | ICD-10-CM | POA: Diagnosis not present

## 2023-05-29 DIAGNOSIS — E785 Hyperlipidemia, unspecified: Secondary | ICD-10-CM

## 2023-05-29 MED ORDER — FLUCONAZOLE 150 MG PO TABS: 150.0000 mg | ORAL_TABLET | Freq: Every day | ORAL | 0 refills | Status: DC

## 2023-05-29 MED ORDER — BREZTRI AEROSPHERE 160-9-4.8 MCG/ACT IN AERO: 2.0000 | INHALATION_SPRAY | Freq: Two times a day (BID) | RESPIRATORY_TRACT | 11 refills | Status: DC

## 2023-05-29 MED ORDER — ALBUTEROL SULFATE HFA 108 (90 BASE) MCG/ACT IN AERS: 2.0000 | INHALATION_SPRAY | RESPIRATORY_TRACT | 2 refills | Status: DC | PRN

## 2023-05-29 MED ORDER — AMOXICILLIN 500 MG PO CAPS
500.0000 mg | ORAL_CAPSULE | Freq: Three times a day (TID) | ORAL | 0 refills | Status: AC
Start: 2023-05-29 — End: 2023-06-08

## 2023-05-29 MED ORDER — TRELEGY ELLIPTA 100-62.5-25 MCG/ACT IN AEPB: 1.0000 | INHALATION_SPRAY | Freq: Every day | RESPIRATORY_TRACT | 11 refills | Status: DC

## 2023-05-29 NOTE — Patient Instructions (Addendum)
 Medications as prescribed.  Referral placed for lung cancer screening.  Call [phone] to schedule mammogram.  Follow up in 6 months.

## 2023-05-30 ENCOUNTER — Encounter: Payer: Self-pay | Admitting: Family Medicine

## 2023-05-30 DIAGNOSIS — K047 Periapical abscess without sinus: Secondary | ICD-10-CM | POA: Insufficient documentation

## 2023-05-30 LAB — LIPID PANEL
Chol/HDL Ratio: 4.1 ratio (ref 0.0–4.4)
Cholesterol, Total: 235 mg/dL — ABNORMAL HIGH (ref 100–199)
HDL: 58 mg/dL (ref >39)
LDL Chol Calc (NIH): 161 mg/dL — ABNORMAL HIGH (ref 0–99)
Triglycerides: 90 mg/dL (ref 0–149)
VLDL Cholesterol Cal: 16 mg/dL (ref 5–40)

## 2023-05-30 LAB — CMP14+EGFR
ALT: 8 IU/L (ref 0–32)
AST: 11 IU/L (ref 0–40)
Albumin: 4.1 g/dL (ref 3.9–4.9)
Alkaline Phosphatase: 128 IU/L — ABNORMAL HIGH (ref 44–121)
BUN/Creatinine Ratio: 16 (ref 12–28)
BUN: 14 mg/dL (ref 8–27)
Bilirubin Total: 0.3 mg/dL (ref 0.0–1.2)
CO2: 29 mmol/L (ref 20–29)
Calcium: 9.6 mg/dL (ref 8.7–10.3)
Chloride: 102 mmol/L (ref 96–106)
Creatinine, Ser: 0.89 mg/dL (ref 0.57–1.00)
Globulin, Total: 3.3 g/dL (ref 1.5–4.5)
Glucose: 87 mg/dL (ref 70–99)
Potassium: 4.6 mmol/L (ref 3.5–5.2)
Sodium: 145 mmol/L — ABNORMAL HIGH (ref 134–144)
Total Protein: 7.4 g/dL (ref 6.0–8.5)
eGFR: 72 mL/min/{1.73_m2} (ref >59)

## 2023-05-30 LAB — CBC
Hematocrit: 47.2 % — ABNORMAL HIGH (ref 34.0–46.6)
Hemoglobin: 15.2 g/dL (ref 11.1–15.9)
MCH: 31.3 pg (ref 26.6–33.0)
MCHC: 32.2 g/dL (ref 31.5–35.7)
MCV: 97 fL (ref 79–97)
Platelets: 217 10*3/uL (ref 150–450)
RBC: 4.86 x10E6/uL (ref 3.77–5.28)
RDW: 12.5 % (ref 11.7–15.4)
WBC: 5.7 10*3/uL (ref 3.4–10.8)

## 2023-05-30 LAB — HEMOGLOBIN A1C
Est. average glucose Bld gHb Est-mCnc: 131 mg/dL
Hgb A1c MFr Bld: 6.2 % — ABNORMAL HIGH (ref 4.8–5.6)

## 2023-05-30 NOTE — Progress Notes (Addendum)
 Subjective:  Patient ID: Lindsay Nelson, female    DOB: 10/29/1960  Age: 63 y.o. MRN: [id number]  CC:   Chief Complaint  Patient presents with   Annual Exam    Gum swelling- would like antibiotic and Diflucan for yeast infection prevention     HPI:  63 year old female presents for an exam.  Patient is a candidate for lung cancer screening.  She would like to proceed with this.  No longer needs cervical cancer screening as she is status post hysterectomy.  Needs mammogram.  She is amenable to this.  Declines DEXA scan.  Amendable to pneumonia vaccine today.  Patient needs routine labs as well.  Patient reports that she has recently had gum swelling.  She states that she has had some left-sided facial swelling as well.  Has poor dentition and was supposed to get her teeth pulled but has not proceeded with this.  Patient is requesting antibiotic therapy regarding this.  Patient's respiratory issues are stable.  She remains on oxygen.  Requesting respiratory.  BP stable.  Patient Active Problem List   Diagnosis Date Noted   Dental infection 05/30/2023   Prediabetes 10/21/2021   Hyperlipidemia 10/21/2021   Lower extremity edema 09/15/2021   Chronic respiratory failure (HCC) 08/22/2021   History of tobacco abuse 08/22/2021   COPD (chronic obstructive pulmonary disease) (HCC) 08/22/2021   Morbid obesity (HCC) 08/22/2021   Hypertension 04/16/2017    Social Hx   Social History   Socioeconomic History   Marital status: Married    Spouse name: Not on file   Number of children: Not on file   Years of education: Not on file   Highest education level: Not on file  Occupational History   Not on file  Tobacco Use   Smoking status: Former    Current packs/day: 0.00    Average packs/day: 1 pack/day for 48.0 years (48.0 ttl pk-yrs)    Types: Cigarettes    Start date: 08/12/1973    Quit date: 08/12/2021    Years since quitting: 1.7   Smokeless tobacco: Current  Vaping Use    Vaping status: Never Used  Substance and Sexual Activity   Alcohol use: Yes    Comment: occasionally    Drug use: Yes    Types: Marijuana    Comment: last use 04/14/2017   Sexual activity: Yes  Other Topics Concern   Not on file  Social History Narrative   Not on file   Social Drivers of Health   Financial Resource Strain: Not on file  Food Insecurity: Not on file  Transportation Needs: Not on file  Physical Activity: Not on file  Stress: Not on file  Social Connections: Not on file    Review of Systems Per HPI  Objective:  BP 128/84   Pulse 73   Temp 98.4 F (36.9 C)   Ht 5\' 8"  (1.727 m)   Wt (!) 331 lb (150.1 kg)   SpO2 97%   BMI 50.33 kg/m      05/29/2023    2:05 PM 06/26/2022   11:41 AM 01/19/2022   10:35 AM  BP/Weight  Systolic BP 128 134 130  Diastolic BP 84 86 84  Wt. (Lbs) 331 329 330.2  BMI 50.33 kg/m2 50.02 kg/m2 50.21 kg/m2    Physical Exam Vitals and nursing note reviewed.  Constitutional:      General: She is not in acute distress.    Appearance: She is obese.  HENT:  Head: Normocephalic and atraumatic.  Cardiovascular:     Rate and Rhythm: Normal rate and regular rhythm.  Pulmonary:     Effort: Pulmonary effort is normal. No respiratory distress.     Breath sounds: Rales present.  Neurological:     Mental Status: She is alert.  Psychiatric:        Mood and Affect: Mood normal.        Behavior: Behavior normal.     Lab Results  Component Value Date   WBC 5.7 05/29/2023   HGB 15.2 05/29/2023   HCT 47.2 (H) 05/29/2023   PLT 217 05/29/2023   GLUCOSE 87 05/29/2023   CHOL 235 (H) 05/29/2023   TRIG 90 05/29/2023   HDL 58 05/29/2023   LDLCALC 161 (H) 05/29/2023   ALT 8 05/29/2023   AST 11 05/29/2023   NA 145 (H) 05/29/2023   K 4.6 05/29/2023   CL 102 05/29/2023   CREATININE 0.89 05/29/2023   BUN 14 05/29/2023   CO2 29 05/29/2023   HGBA1C 6.2 (H) 05/29/2023     Assessment & Plan:  Pulmonary emphysema, unspecified  emphysema type (HCC) Assessment & Plan: Stable.  Continue home oxygen.  Rx sent for [employer].  Orders: -     Ambulatory Referral for Lung Cancer Scre  History of tobacco abuse -     Ambulatory Referral for Lung Cancer Scre  Immunization due -     Pneumococcal conjugate vaccine 20-valent  Hyperlipidemia, unspecified hyperlipidemia type Assessment & Plan: Labs today to assess.  Orders: -     Lipid panel  Prediabetes -     CMP14+EGFR -     Hemoglobin A1c  Screening for deficiency anemia -     CBC  Primary hypertension Assessment & Plan: Stable continue Lasix.   Dental infection Assessment & Plan: Treating with amoxicillin.   Other orders -     Albuterol Sulfate HFA; Inhale 2 puffs into the lungs every 4 (four) hours as needed for wheezing or shortness of breath.  Dispense: 6.7 each; Refill: 2 -     Fluconazole; Take 1 tablet (150 mg total) by mouth daily.  Dispense: 2 tablet; Refill: 0 -     Breztri Aerosphere; Inhale 2 puffs into the lungs 2 (two) times daily.  Dispense: 10.7 g; Refill: 11 -     Amoxicillin; Take 1 capsule (500 mg total) by mouth 3 (three) times daily for 10 days.  Dispense: 30 capsule; Refill: 0    Follow-up: 6 months  Lilliemae Fruge Debrah Fan DO [facility]

## 2023-05-30 NOTE — Assessment & Plan Note (Signed)
Treating with amoxicillin. 

## 2023-05-30 NOTE — Assessment & Plan Note (Signed)
Labs today to assess.   

## 2023-05-30 NOTE — Assessment & Plan Note (Signed)
 Stable.  Continue home oxygen.  Rx sent for [employer].

## 2023-05-30 NOTE — Assessment & Plan Note (Signed)
Stable; continue Lasix

## 2023-06-01 ENCOUNTER — Other Ambulatory Visit: Payer: Self-pay | Admitting: Family Medicine

## 2023-06-01 MED ORDER — ROSUVASTATIN CALCIUM 10 MG PO TABS: 10.0000 mg | ORAL_TABLET | Freq: Every day | ORAL | 3 refills | Status: AC

## 2023-06-06 ENCOUNTER — Ambulatory Visit: Payer: Self-pay

## 2023-06-06 NOTE — Telephone Encounter (Signed)
 Pt would like to know if the pt can take Oil of Oregano with Black Seed Oil with her other medications. RN advised pt RN would relay the question to the office and provider for follow-up.   Copied from CRM [id number]. Topic: Clinical - Medication Question >> Jun 06, 2023 11:01 AM Elle L wrote: Reason for CRM: The patient is requesting to see if taking ovol oregano with black seed oil 6000 MG would cause interactions with her current medications. Her call back number is [phone].

## 2023-06-06 NOTE — Telephone Encounter (Signed)
 Spoke with patient and informed per provider recommendations.

## 2023-06-07 NOTE — Progress Notes (Signed)
 Done

## 2023-07-14 ENCOUNTER — Other Ambulatory Visit: Payer: Self-pay | Admitting: Family Medicine

## 2023-07-19 ENCOUNTER — Other Ambulatory Visit: Payer: Self-pay | Admitting: Family Medicine

## 2023-09-18 ENCOUNTER — Other Ambulatory Visit: Payer: Self-pay | Admitting: Family Medicine

## 2023-10-22 ENCOUNTER — Other Ambulatory Visit: Payer: Self-pay | Admitting: Family Medicine

## 2023-10-23 ENCOUNTER — Other Ambulatory Visit: Payer: Self-pay

## 2023-10-23 MED ORDER — POTASSIUM CHLORIDE CRYS ER 10 MEQ PO TBCR: 10.0000 meq | EXTENDED_RELEASE_TABLET | Freq: Every day | ORAL | 0 refills | Status: DC

## 2023-11-27 ENCOUNTER — Other Ambulatory Visit: Payer: Self-pay | Admitting: Family Medicine

## 2023-11-28 ENCOUNTER — Ambulatory Visit: Admitting: Family Medicine

## 2023-11-28 ENCOUNTER — Other Ambulatory Visit: Payer: Self-pay | Admitting: Family Medicine

## 2023-11-28 ENCOUNTER — Encounter: Payer: Self-pay | Admitting: Family Medicine

## 2023-11-28 VITALS — BP 134/84 | HR 103 | Ht 68.0 in | Wt 337.0 lb

## 2023-11-28 DIAGNOSIS — J9611 Chronic respiratory failure with hypoxia: Secondary | ICD-10-CM | POA: Diagnosis not present

## 2023-11-28 DIAGNOSIS — Z23 Encounter for immunization: Secondary | ICD-10-CM | POA: Diagnosis not present

## 2023-11-28 DIAGNOSIS — I10 Essential (primary) hypertension: Secondary | ICD-10-CM

## 2023-11-28 DIAGNOSIS — J439 Emphysema, unspecified: Secondary | ICD-10-CM | POA: Diagnosis not present

## 2023-11-28 DIAGNOSIS — G43009 Migraine without aura, not intractable, without status migrainosus: Secondary | ICD-10-CM

## 2023-11-28 MED ORDER — NURTEC 75 MG PO TBDP: ORAL_TABLET | ORAL | 5 refills | Status: DC

## 2023-11-28 NOTE — Patient Instructions (Addendum)
 Call [phone] to schedule mammogram.  Will see if insurance will cover Nurtec. Get your eyes checked.  Follow up in 3-6 months.  Take care  Dr. Bluford

## 2023-11-29 ENCOUNTER — Telehealth: Payer: Self-pay

## 2023-11-29 DIAGNOSIS — G43909 Migraine, unspecified, not intractable, without status migrainosus: Secondary | ICD-10-CM | POA: Insufficient documentation

## 2023-11-29 NOTE — Assessment & Plan Note (Signed)
 Stable

## 2023-11-29 NOTE — Progress Notes (Signed)
 Subjective:  Patient ID: Lindsay Nelson, female    DOB: 1960/12/05  Age: 63 y.o. MRN: [id number]  CC:   Chief Complaint  Patient presents with   Care Management    Six month follow up   Headache    Headache with eye pain atleast 2-3 times a week     HPI:  63 year old female with HTN, COPD and Chronic respiratory failure, Morbid obesity presents for follow up.  Patient having ongoing headaches. This has been a long term issue (many years) but has been worsening recently. Now having headaches 2-3 times a weak. Sharp. Associated photophobia. Improves with Ibuprofen .  Patient requesting to see Pulmonology locally. Does not want to travel to [place].  Needs Flu shot. Amendable today.  Needs Mammogram.   Also reports ongoing LE edema (chronic).  Patient Active Problem List   Diagnosis Date Noted   Migraine 11/29/2023   Prediabetes 10/21/2021   Hyperlipidemia 10/21/2021   Lower extremity edema 09/15/2021   Chronic respiratory failure (HCC) 08/22/2021   History of tobacco abuse 08/22/2021   COPD (chronic obstructive pulmonary disease) (HCC) 08/22/2021   Morbid obesity (HCC) 08/22/2021   Hypertension 04/16/2017    Social Hx   Social History   Socioeconomic History   Marital status: Married    Spouse name: Not on file   Number of children: Not on file   Years of education: Not on file   Highest education level: Not on file  Occupational History   Not on file  Tobacco Use   Smoking status: Former    Current packs/day: 0.00    Average packs/day: 1 pack/day for 48.0 years (48.0 ttl pk-yrs)    Types: Cigarettes    Start date: 08/12/1973    Quit date: 08/12/2021    Years since quitting: 2.2   Smokeless tobacco: Current  Vaping Use   Vaping status: Never Used  Substance and Sexual Activity   Alcohol use: Yes    Comment: occasionally    Drug use: Yes    Types: Marijuana    Comment: last use 04/14/2017   Sexual activity: Yes  Other Topics Concern   Not on file   Social History Narrative   Not on file   Social Drivers of Health   Financial Resource Strain: Not on file  Food Insecurity: Not on file  Transportation Needs: Not on file  Physical Activity: Not on file  Stress: Not on file  Social Connections: Not on file    Review of Systems Per HPI  Objective:  BP 134/84   Pulse (!) 103   Ht 5' 8 (1.727 m)   Wt (!) 337 lb (152.9 kg)   SpO2 93%   BMI 51.24 kg/m      11/28/2023    1:35 PM 05/29/2023    2:05 PM 06/26/2022   11:41 AM  BP/Weight  Systolic BP 134 128 134  Diastolic BP 84 84 86  Wt. (Lbs) 337 331 329  BMI 51.24 kg/m2 50.33 kg/m2 50.02 kg/m2    Physical Exam Vitals and nursing note reviewed.  Constitutional:      General: She is not in acute distress. HENT:     Head: Normocephalic and atraumatic.  Cardiovascular:     Rate and Rhythm: Normal rate and regular rhythm.  Pulmonary:     Effort: No respiratory distress.     Breath sounds: Wheezing present.  Neurological:     Mental Status: She is alert.  Psychiatric:  Mood and Affect: Mood normal.        Behavior: Behavior normal.     Lab Results  Component Value Date   WBC 5.7 05/29/2023   HGB 15.2 05/29/2023   HCT 47.2 (H) 05/29/2023   PLT 217 05/29/2023   GLUCOSE 87 05/29/2023   CHOL 235 (H) 05/29/2023   TRIG 90 05/29/2023   HDL 58 05/29/2023   LDLCALC 161 (H) 05/29/2023   ALT 8 05/29/2023   AST 11 05/29/2023   NA 145 (H) 05/29/2023   K 4.6 05/29/2023   CL 102 05/29/2023   CREATININE 0.89 05/29/2023   BUN 14 05/29/2023   CO2 29 05/29/2023   HGBA1C 6.2 (H) 05/29/2023     Assessment & Plan:  Chronic respiratory failure with hypoxia [facility]) Assessment & Plan: Referring to pulmonary locally as requested.  Orders: -     Pulmonary Visit  Chronic obstructive pulmonary disease with emphysema, unspecified emphysema type (HCC) Assessment & Plan: Referring to pulmonary locally as requested.  Orders: -     Pulmonary Visit  Encounter for  immunization -     Flu vaccine HIGH DOSE PF(Fluzone Trivalent)  Primary hypertension Assessment & Plan: Stable.   Migraine without aura and without status migrainosus, not intractable Assessment & Plan: Starting on Nurtec.     Follow-up:  Return in about 6 months (around 05/28/2024).  Jacqulyn Ahle DO [facility]

## 2023-11-29 NOTE — Assessment & Plan Note (Signed)
 Referring to pulmonary locally as requested.

## 2023-11-29 NOTE — Telephone Encounter (Signed)
 Copied from CRM [id number]. Topic: Clinical - Prescription Issue >> Nov 29, 2023  3:27 PM Mia F wrote: Reason for CRM: Melody from [employer] called stating that the NURTEC 75 MG TBDP is not covered under pt insurance. She will need to get EMGALATY, QULIPTA, UBRELVY, SUMATRITAN-SUPCINATE, NARAPRITCAN-HCL, RIZATRITEAN-BENZOATE, NAPROXEN, or IBUPROFEN . These will need a standard PA.   If any questions please call [phone]

## 2023-11-29 NOTE — Assessment & Plan Note (Signed)
 Starting on Nurtec.

## 2023-12-02 ENCOUNTER — Other Ambulatory Visit: Payer: Self-pay | Admitting: Family Medicine

## 2023-12-02 MED ORDER — QULIPTA 60 MG PO TABS: 60.0000 mg | ORAL_TABLET | Freq: Every day | ORAL | 3 refills | Status: DC

## 2023-12-03 ENCOUNTER — Other Ambulatory Visit (HOSPITAL_COMMUNITY): Payer: Self-pay

## 2023-12-03 ENCOUNTER — Telehealth: Payer: Self-pay | Admitting: Pharmacy Technician

## 2023-12-03 NOTE — Telephone Encounter (Signed)
 Pharmacy Patient Advocate Encounter   Received notification from CoverMyMeds that prior authorization for Qulipta 60MG  tablets is required/requested.   Insurance verification completed.   The patient is insured through [place].   Per test claim: PA required; PA submitted to above mentioned insurance via Latent Key/confirmation #/EOC [id number] Status is pending

## 2023-12-03 NOTE — Telephone Encounter (Signed)
 Cook, Jayce G, DO      12/02/23  1:27 PM Rx sent for [employer].

## 2023-12-03 NOTE — Telephone Encounter (Signed)
 Pharmacy Patient Advocate Encounter  Received notification from [employer] that Prior Authorization for Qulipta 60MG  tablets has been APPROVED from 12/03/2023 to 02/19/2025. Ran test claim, Copay is $654.89. This test claim was processed through [facility]- copay amounts may vary at other pharmacies due to pharmacy/plan contracts, or as the patient moves through the different stages of their insurance plan.   PA #/Case ID/Reference #: [id number]  Copay applied to her deductible of $450.00. Please have patient contact her pharmacy to fill if she is comfortable with the copay.

## 2023-12-04 ENCOUNTER — Other Ambulatory Visit (HOSPITAL_COMMUNITY): Payer: Self-pay

## 2023-12-04 ENCOUNTER — Other Ambulatory Visit: Payer: Self-pay | Admitting: Family Medicine

## 2023-12-04 MED ORDER — RIZATRIPTAN BENZOATE 10 MG PO TABS: 10.0000 mg | ORAL_TABLET | ORAL | 3 refills | Status: DC | PRN

## 2023-12-05 NOTE — Telephone Encounter (Signed)
 Cook, Jayce G, [place]      12/04/23  8:46 PM Rx sent for [employer].  please inform patient.

## 2023-12-06 NOTE — Telephone Encounter (Signed)
 Patient notified

## 2023-12-06 NOTE — Telephone Encounter (Unsigned)
 Copied from CRM [id number]. Topic: General - Call Back - No Documentation >> Dec 06, 2023  1:19 PM Gustabo D wrote: Pt returning call

## 2023-12-06 NOTE — Telephone Encounter (Signed)
Telephone call- mailbox is full 

## 2023-12-13 ENCOUNTER — Telehealth: Payer: Self-pay | Admitting: *Deleted

## 2023-12-13 NOTE — Telephone Encounter (Unsigned)
 Copied from CRM #[id number]. Topic: Clinical - Medication Question >> Dec 11, 2023  2:08 PM Tysheama G wrote: Reason for CRM: Patient's husband calling regarding wife medication Atogepant (QULIPTA) 60 MG, he stated their insurance [employer] has approved it and can Dr.Cook put the order in.

## 2023-12-13 NOTE — Telephone Encounter (Signed)
Telephone call- mailbox is full 

## 2023-12-14 NOTE — Telephone Encounter (Signed)
 Patient notified and verbalized understanding- stated she could not afford the copay for the other 2 meds prescibed

## 2023-12-31 ENCOUNTER — Ambulatory Visit: Admitting: Internal Medicine

## 2023-12-31 ENCOUNTER — Encounter: Payer: Self-pay | Admitting: Internal Medicine

## 2023-12-31 VITALS — BP 140/84 | HR 86 | Ht 68.0 in | Wt 337.2 lb

## 2023-12-31 DIAGNOSIS — Z6841 Body Mass Index (BMI) 40.0 and over, adult: Secondary | ICD-10-CM

## 2023-12-31 DIAGNOSIS — J449 Chronic obstructive pulmonary disease, unspecified: Secondary | ICD-10-CM | POA: Diagnosis not present

## 2023-12-31 DIAGNOSIS — J9611 Chronic respiratory failure with hypoxia: Secondary | ICD-10-CM

## 2023-12-31 MED ORDER — PREDNISONE 10 MG PO TABS: ORAL_TABLET | ORAL | 0 refills | Status: AC

## 2023-12-31 MED ORDER — BREZTRI AEROSPHERE 160-9-4.8 MCG/ACT IN AERO: 2.0000 | INHALATION_SPRAY | Freq: Two times a day (BID) | RESPIRATORY_TRACT | Status: AC

## 2023-12-31 MED ORDER — ALBUTEROL SULFATE (2.5 MG/3ML) 0.083% IN NEBU: 2.5000 mg | INHALATION_SOLUTION | RESPIRATORY_TRACT | 12 refills | Status: AC | PRN

## 2023-12-31 MED ORDER — AZITHROMYCIN 250 MG PO TABS: ORAL_TABLET | ORAL | 0 refills | Status: AC

## 2023-12-31 NOTE — Assessment & Plan Note (Addendum)
 CT 08/12/21  Underlying emphysematous changes  - PFT's  08/2021  FEV1 1.20 (41 % ) ratio 0.62  p 48 % improvement from saba p 0 prior to study with DLCO  8.47 (37%)   and FV curve mildly concave at 311 lb  - 12/31/2023 @ 337lb   Walked on 3lpm POC   x  1  lap(s) =  approx 150  ft  @ slow  pace, stopped due to sob  with lowest 02 sats 89%   - 12/31/2023  After extensive coaching inhaler device,  effectiveness =    75% hfa > breztri  samples / zpak/ flutter /pred x 6 and f/u in 4 weeks with inhalers /flutter/ POC  Group E in terms of symptoms/risk so  laba/lama/ICS  therefore appropriate rx at this point >>>  breztri    and approp SABA prn. (Both hfa and neb)  Re SABA :  I spent extra time with pt today reviewing appropriate use of albuterol  for prn use on exertion with the following points: 1) saba is for relief of sob that does not improve by walking a slower pace or resting but rather if the pt does not improve after trying this first. 2) If the pt is convinced, as many are, that saba helps recover from activity faster then it's easy to tell if this is the case by re-challenging : ie stop, take the inhaler, then p 5 minutes try the exact same activity (intensity of workload) that just caused the symptoms and see if they are substantially diminished or not after saba 3) if there is an activity that reproducibly causes the symptoms, try the saba 15 min before the activity on alternate days   If in fact the saba really does help, then fine to continue to use it prn but advised may need to look closer at the maintenance regimen (breztri ) being used to achieve better control of airways disease with exertion.

## 2023-12-31 NOTE — Progress Notes (Signed)
 Lindsay Nelson, female    DOB: 04/11/1960    MRN: [id number]   Brief patient profile:  32  yobf  quit cigarette smoker  07/2021  former [facility]  Pt self-referred back to pulmonary clinic in [place]  12/31/2023  for COPD     CT 08/12/21  Underlying emphysematous changes   PFT's  08/2021  FEV1 1.20 (41 % ) ratio 0.62  p 48 % improvement from saba p 0 prior to study with DLCO  8.47 (37%)   and FV curve mildly concave  wt 311      History of Present Illness  12/31/2023  Pulmonary/ 1st office eval/ Alberto Schoch / [place] [facility] Breztri  empty / wt 337 lb Chief Complaint  Patient presents with   COPD    TOC Shob - productive cough   Dyspnea:  very immobile  Cough: wakes up with cough and last all day long  Sleep: recliner 30-45 degrees x one year  SABA use: once or twice  a day at most  02 use:4lpm conc /  not using at rest / 4lpm walking in house  / and 3lpm on POC     No obvious day to day or daytime pattern/variability or assoc  mucus plugs or hemoptysis or cp or chest tightness, subjective wheeze or overt sinus or hb symptoms.    Also denies any obvious fluctuation of symptoms with weather or environmental changes or other aggravating or alleviating factors except as outlined above   No unusual exposure hx or h/o childhood pna/ asthma or knowledge of premature birth.  Current Allergies, Complete Past Medical History, Past Surgical History, Family History, and Social History were reviewed in [employer] record.  ROS  The following are not active complaints unless bolded Hoarseness, sore throat, dysphagia, dental problems, itching, sneezing,  nasal congestion or discharge of excess mucus or purulent secretions, ear ache,   fever, chills, sweats, unintended wt loss or wt gain, classically pleuritic or exertional cp,  orthopnea pnd or arm/hand swelling  or leg swelling, presyncope, palpitations, abdominal pain, anorexia, nausea, vomiting, diarrhea  or change in bowel  habits or change in bladder habits, change in stools or change in urine, dysuria, hematuria,  rash, arthralgias, visual complaints, headache, numbness, weakness or ataxia or problems with walking or coordination,  change in mood or  memory.            Outpatient Medications Prior to Visit  Medication Sig Dispense Refill   albuterol  (VENTOLIN  HFA) 108 (90 Base) MCG/ACT inhaler INHALE 2 PUFFS INTO THE LUNGS EVERY 4 HOURS AS NEEDED FOR WHEEZING OR SHORTNESS OF BREATH. 6.7 each 2   budeson-glycopyrrolate-formoterol (BREZTRI  AEROSPHERE) 160-9-4.8 MCG/ACT AERO inhaler Inhale 2 puffs into the lungs 2 (two) times daily. 10.7 g 11   cetirizine (ZYRTEC) 10 MG tablet Take 10 mg by mouth daily.     clobetasol  ointment (TEMOVATE ) 0.05 % APPLY TO AFFECTED AREA TWICE A DAY 30 g 0   diphenhydrAMINE  (BENADRYL  ALLERGY) 25 MG tablet Take 25 mg by mouth every 6 (six) hours as needed.     fluconazole  (DIFLUCAN ) 150 MG tablet Take 1 tablet (150 mg total) by mouth daily. 2 tablet 0   fluticasone  (FLONASE ) 50 MCG/ACT nasal spray Place 2 sprays into both nostrils daily. 16 g 6   furosemide  (LASIX ) 20 MG tablet TAKE 1 TABLET BY MOUTH EVERY DAY 90 tablet 0   OXYGEN Inhale into the lungs. 2 lpm     potassium chloride  (KLOR-CON  M)  10 MEQ tablet TAKE 1 TABLET BY MOUTH EVERY DAY 30 tablet 0   Rimegepant Sulfate (NURTEC PO) Take by mouth.     rizatriptan (MAXALT) 10 MG tablet Take 1 tablet (10 mg total) by mouth as needed for migraine. May repeat in 2 hours if needed (Patient not taking: Reported on 12/31/2023) 10 tablet 3   rosuvastatin  (CRESTOR ) 10 MG tablet Take 1 tablet (10 mg total) by mouth daily. 90 tablet 3   Atogepant (QULIPTA) 60 MG TABS Take 1 tablet (60 mg total) by mouth daily. (Patient not taking: Reported on 12/31/2023) 90 tablet 3   Fexofenadine HCl (ALLEGRA ALLERGY PO) Take 1 tablet by mouth daily. (Patient not taking: Reported on 12/31/2023)     No facility-administered medications prior to visit.     Past Medical History:  Diagnosis Date   COPD (chronic obstructive pulmonary disease) (HCC)    Hypertension    Perennial allergic rhinitis    Tobacco use       Objective:     BP (!) 140/84   Pulse 86   Ht 5' 8 (1.727 m)   Wt (!) 337 lb 3.2 oz (153 kg)   SpO2 92% Comment: 3 l poc  BMI 51.27 kg/m   SpO2: 92 % (3 l poc) MO (by bmi) amb wf nad   HEENT : Oropharynx  clear      NECK :  without  apparent JVD/ palpable Nodes/TM    LUNGS: no acc muscle use,  Mild barrel  contour chest wall with bilateral  Distant bs s audible wheeze and  without cough on insp or exp maneuvers  and mild  Hyperresonant  to  percussion bilaterally     CV:  RRR  no s3 or murmur or increase in P2, and no edema   ABD:  soft and nontender   MS:  Nl gait/ ext warm without deformities Or obvious joint restrictions  calf tenderness, cyanosis or clubbing     SKIN: warm and dry without lesions    NEURO:  alert, approp, nl sensorium with  no motor or cerebellar deficits apparent.     Cxr 12/31/2023 rec >>>    Assessment   Assessment & Plan COPD GOLD 3 CT 08/12/21  Underlying emphysematous changes  - PFT's  08/2021  FEV1 1.20 (41 % ) ratio 0.62  p 48 % improvement from saba p 0 prior to study with DLCO  8.47 (37%)   and FV curve mildly concave at 311 lb  - 12/31/2023 @ 337lb   Walked on 3lpm POC   x  1  lap(s) =  approx 150  ft  @ slow  pace, stopped due to sob  with lowest 02 sats 89%   - 12/31/2023  After extensive coaching inhaler device,  effectiveness =    75% hfa > breztri  samples / zpak/ flutter /pred x 6 and f/u in 4 weeks with inhalers /flutter/ POC  Group E in terms of symptoms/risk so  laba/lama/ICS  therefore appropriate rx at this point >>>  breztri    and approp SABA prn. (Both hfa and neb)  Re SABA :  I spent extra time with pt today reviewing appropriate use of albuterol  for prn use on exertion with the following points: 1) saba is for relief of sob that does not improve by  walking a slower pace or resting but rather if the pt does not improve after trying this first. 2) If the pt is convinced, as many are, that  saba helps recover from activity faster then it's easy to tell if this is the case by re-challenging : ie stop, take the inhaler, then p 5 minutes try the exact same activity (intensity of workload) that just caused the symptoms and see if they are substantially diminished or not after saba 3) if there is an activity that reproducibly causes the symptoms, try the saba 15 min before the activity on alternate days   If in fact the saba really does help, then fine to continue to use it prn but advised may need to look closer at the maintenance regimen (breztri ) being used to achieve better control of airways disease with exertion.      Chronic respiratory failure with hypoxia (HCC) 02 dep 24/7 x 2023 Rainelle) - - 12/31/2023 @ 337lb   Walked on 3lpm POC   x  1  lap(s) =  approx 150  ft  @ slow  pace, stopped due to sob  with lowest 02 sats 89%   Advised pace herself and titrate 02 daytime to keep sats > 90%     Morbid obesity due to excess calories (HCC) Body mass index is 51.27 kg/m.  -   No results found for: TSH   Contributing to doe and risk of GERD/dvt/ PE  >>>   reviewed the need and the process to achieve and maintain neg calorie balance > defer f/u primary care including intermittently monitoring thyroid status             Each maintenance medication was reviewed in detail including emphasizing most importantly the difference between maintenance and prns and under what circumstances the prns are to be triggered using an action plan format where appropriate.  Total time for H and P, chart review, counseling, reviewing hfa/flutter/ POC/ 02 /neb  device(s) and generating customized AVS unique to this office visit / same day charting = 45 min pt new to me.         AVS  Patient Instructions  Plan A = Automatic = Always=  Breztri  Take 2 puffs  first thing in am and then another 2 puffs about 12 hours later.   - Work on inhaler technique:  relax and gently blow all the way out then take a nice smooth full deep breath back in, triggering the inhaler at same time you start breathing in.  Hold breath in for at least  5 seconds if you can. Blow out Breztri   thru nose. Rinse and gargle with water when done.  If mouth or throat bother you at all,  try brushing teeth/gums/tongue with arm and hammer toothpaste/ make a slurry and gargle and spit out.     Plan B = Backup (to supplement plan A, not to replace it) Use your albuterol  inhaler as a rescue medication to be used if you can't catch your breath by resting or slowing your pace  or doing a relaxed purse lip breathing pattern.  - The less you use it, the better it will work when you need it. - Ok to use the inhaler up to 2 puffs  every 4 hours if you must but call for appointment if use goes up over your usual need - Don't leave home without it !!  (think of it like the spare tire or starter fluid for your car)   Plan C = Crisis (instead of Plan B but only if Plan B stops working) - only use your albuterol  nebulizer if you first try Plan B  and it fails to help > ok to use the nebulizer up to every 4 hours but if start needing it regularly call for immediate appointment  Zpak   Prednisone  10 mg take  4 each am x 2 days,   2 each am x 2 days,  1 each am x 2 days and stop   For cough/ congestion > Mucinex  or Mucinex  dm  up to maximum of  1200 mg every 12 hours and use the flutter valve as much as you can    Make sure you check your oxygen saturation  AT  your highest level of activity (not after you stop)   to be sure it stays over 90% and adjust  02 flow upward to maintain this level if needed but remember to turn it back to previous settings when you stop (to conserve your supply).   The key is to stop ALL  smoking completely before smoking completely stops you!  Please schedule a follow  up office visit in 4 weeks, sooner if needed  with inhalers /flutter valve      Ozell America, MD 12/31/2023

## 2023-12-31 NOTE — Assessment & Plan Note (Addendum)
 Body mass index is 51.27 kg/m.  -   No results found for: TSH   Contributing to doe and risk of GERD/dvt/ PE  >>>   reviewed the need and the process to achieve and maintain neg calorie balance > defer f/u primary care including intermittently monitoring thyroid status             Each maintenance medication was reviewed in detail including emphasizing most importantly the difference between maintenance and prns and under what circumstances the prns are to be triggered using an action plan format where appropriate.  Total time for H and P, chart review, counseling, reviewing hfa/flutter/ POC/ 02 /neb  device(s) and generating customized AVS unique to this office visit / same day charting = 45 min pt new to me.

## 2023-12-31 NOTE — Assessment & Plan Note (Addendum)
 02 dep 24/7 x 2023 Rainelle) - - 12/31/2023 @ 337lb   Walked on 3lpm POC   x  1  lap(s) =  approx 150  ft  @ slow  pace, stopped due to sob  with lowest 02 sats 89%   Advised pace herself and titrate 02 daytime to keep sats > 90%

## 2023-12-31 NOTE — Patient Instructions (Addendum)
 Plan A = Automatic = Always=  Breztri  Take 2 puffs first thing in am and then another 2 puffs about 12 hours later.   - Work on inhaler technique:  relax and gently blow all the way out then take a nice smooth full deep breath back in, triggering the inhaler at same time you start breathing in.  Hold breath in for at least  5 seconds if you can. Blow out Breztri   thru nose. Rinse and gargle with water when done.  If mouth or throat bother you at all,  try brushing teeth/gums/tongue with arm and hammer toothpaste/ make a slurry and gargle and spit out.     Plan B = Backup (to supplement plan A, not to replace it) Use your albuterol  inhaler as a rescue medication to be used if you can't catch your breath by resting or slowing your pace  or doing a relaxed purse lip breathing pattern.  - The less you use it, the better it will work when you need it. - Ok to use the inhaler up to 2 puffs  every 4 hours if you must but call for appointment if use goes up over your usual need - Don't leave home without it !!  (think of it like the spare tire or starter fluid for your car)   Plan C = Crisis (instead of Plan B but only if Plan B stops working) - only use your albuterol  nebulizer if you first try Plan B and it fails to help > ok to use the nebulizer up to every 4 hours but if start needing it regularly call for immediate appointment  Zpak   Prednisone  10 mg take  4 each am x 2 days,   2 each am x 2 days,  1 each am x 2 days and stop   For cough/ congestion > Mucinex  or Mucinex  dm  up to maximum of  1200 mg every 12 hours and use the flutter valve as much as you can    Make sure you check your oxygen saturation  AT  your highest level of activity (not after you stop)   to be sure it stays over 90% and adjust  02 flow upward to maintain this level if needed but remember to turn it back to previous settings when you stop (to conserve your supply).   The key is to stop ALL  smoking completely before smoking  completely stops you!  Please schedule a follow up office visit in 4 weeks, sooner if needed  with inhalers /flutter valve

## 2024-01-01 DIAGNOSIS — J439 Emphysema, unspecified: Secondary | ICD-10-CM | POA: Diagnosis not present

## 2024-01-01 DIAGNOSIS — Z809 Family history of malignant neoplasm, unspecified: Secondary | ICD-10-CM | POA: Diagnosis not present

## 2024-01-01 DIAGNOSIS — Z8249 Family history of ischemic heart disease and other diseases of the circulatory system: Secondary | ICD-10-CM | POA: Diagnosis not present

## 2024-01-01 DIAGNOSIS — J9610 Chronic respiratory failure, unspecified whether with hypoxia or hypercapnia: Secondary | ICD-10-CM | POA: Diagnosis not present

## 2024-01-01 DIAGNOSIS — Z833 Family history of diabetes mellitus: Secondary | ICD-10-CM | POA: Diagnosis not present

## 2024-01-01 DIAGNOSIS — G43909 Migraine, unspecified, not intractable, without status migrainosus: Secondary | ICD-10-CM | POA: Diagnosis not present

## 2024-01-01 DIAGNOSIS — E785 Hyperlipidemia, unspecified: Secondary | ICD-10-CM | POA: Diagnosis not present

## 2024-01-01 DIAGNOSIS — R32 Unspecified urinary incontinence: Secondary | ICD-10-CM | POA: Diagnosis not present

## 2024-01-01 DIAGNOSIS — I10 Essential (primary) hypertension: Secondary | ICD-10-CM | POA: Diagnosis not present

## 2024-01-01 DIAGNOSIS — Z6841 Body Mass Index (BMI) 40.0 and over, adult: Secondary | ICD-10-CM | POA: Diagnosis not present

## 2024-01-01 DIAGNOSIS — E876 Hypokalemia: Secondary | ICD-10-CM | POA: Diagnosis not present

## 2024-01-02 ENCOUNTER — Telehealth: Payer: Self-pay

## 2024-01-02 NOTE — Telephone Encounter (Signed)
 Copied from CRM [id number]. Topic: Clinical - Medical Advice >> Jan 02, 2024  9:08 AM Isabell A wrote: Reason for CRM: Patient calling to inform Dr.Wert that she does have a nebulizer.  FYI

## 2024-01-03 ENCOUNTER — Other Ambulatory Visit: Payer: Self-pay | Admitting: Family Medicine

## 2024-01-11 ENCOUNTER — Other Ambulatory Visit: Payer: Self-pay | Admitting: Physician Assistant

## 2024-01-11 MED ORDER — FLUCONAZOLE 150 MG PO TABS: 150.0000 mg | ORAL_TABLET | Freq: Once | ORAL | 0 refills | Status: AC

## 2024-01-11 NOTE — Telephone Encounter (Unsigned)
 Copied from CRM [id number]. Topic: Clinical - Medication Question >> Jan 11, 2024 12:48 PM Delon DASEN wrote: Reason for CRM: :Pt has yeast infection from antibiotics.  Please send to: [id number] - [place], [place] - [address] AT [facility]  [address] [place] [address] Phone: [phone] Fax: [phone]

## 2024-01-15 ENCOUNTER — Other Ambulatory Visit: Payer: Self-pay | Admitting: Internal Medicine

## 2024-01-15 MED ORDER — BREZTRI AEROSPHERE 160-9-4.8 MCG/ACT IN AERO: 2.0000 | INHALATION_SPRAY | Freq: Two times a day (BID) | RESPIRATORY_TRACT | 3 refills | Status: AC

## 2024-01-29 ENCOUNTER — Ambulatory Visit: Admitting: Internal Medicine

## 2024-03-10 ENCOUNTER — Other Ambulatory Visit: Payer: Self-pay | Admitting: Family Medicine

## 2024-03-10 ENCOUNTER — Ambulatory Visit: Admitting: Internal Medicine

## 2024-04-03 ENCOUNTER — Ambulatory Visit

## 2024-05-06 ENCOUNTER — Ambulatory Visit: Admitting: Internal Medicine

## 2024-05-28 ENCOUNTER — Ambulatory Visit: Admitting: Family Medicine
